# Patient Record
Sex: Male | Born: 1960 | Race: White | Hispanic: No | Smoking: Never smoker
Health system: Southern US, Community
[De-identification: ages and names within clinical notes are randomized; demographics above are authoritative.]

## PROBLEM LIST (undated history)

## (undated) DIAGNOSIS — R51 Headache: Secondary | ICD-10-CM

## (undated) DIAGNOSIS — M2550 Pain in unspecified joint: Secondary | ICD-10-CM

## (undated) DIAGNOSIS — H33313 Horseshoe tear of retina without detachment, bilateral: Secondary | ICD-10-CM

## (undated) DIAGNOSIS — H269 Unspecified cataract: Secondary | ICD-10-CM

## (undated) DIAGNOSIS — M1990 Unspecified osteoarthritis, unspecified site: Secondary | ICD-10-CM

## (undated) DIAGNOSIS — S83106A Unspecified dislocation of unspecified knee, initial encounter: Secondary | ICD-10-CM

## (undated) DIAGNOSIS — E785 Hyperlipidemia, unspecified: Secondary | ICD-10-CM

## (undated) DIAGNOSIS — R569 Unspecified convulsions: Secondary | ICD-10-CM

## (undated) DIAGNOSIS — I10 Essential (primary) hypertension: Secondary | ICD-10-CM

## (undated) DIAGNOSIS — H5440 Blindness, one eye, unspecified eye: Secondary | ICD-10-CM

## (undated) HISTORY — PX: POLYPECTOMY: SHX149

## (undated) HISTORY — PX: WISDOM TOOTH EXTRACTION: SHX21

## (undated) HISTORY — PX: OTHER SURGICAL HISTORY: SHX169

## (undated) HISTORY — PX: EYE SURGERY: SHX253

## (undated) HISTORY — PX: COLONOSCOPY: SHX174

---
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
## 1999-01-26 ENCOUNTER — Emergency Department (HOSPITAL_COMMUNITY): Admission: EM | Admit: 1999-01-26 | Discharge: 1999-01-26 | Payer: Self-pay | Admitting: Emergency Medicine

## 2001-08-10 ENCOUNTER — Emergency Department (HOSPITAL_COMMUNITY): Admission: AC | Admit: 2001-08-10 | Discharge: 2001-08-10 | Payer: Self-pay

## 2001-08-10 ENCOUNTER — Encounter: Payer: Self-pay | Admitting: Emergency Medicine

## 2001-08-16 ENCOUNTER — Emergency Department (HOSPITAL_COMMUNITY): Admission: EM | Admit: 2001-08-16 | Discharge: 2001-08-16 | Payer: Self-pay | Admitting: Emergency Medicine

## 2002-07-21 ENCOUNTER — Inpatient Hospital Stay (HOSPITAL_COMMUNITY): Admission: AC | Admit: 2002-07-21 | Discharge: 2002-07-23 | Payer: Self-pay

## 2005-01-03 ENCOUNTER — Ambulatory Visit (HOSPITAL_COMMUNITY): Admission: RE | Admit: 2005-01-03 | Discharge: 2005-01-03 | Payer: Self-pay | Admitting: Internal Medicine

## 2007-02-10 ENCOUNTER — Emergency Department (HOSPITAL_COMMUNITY): Admission: EM | Admit: 2007-02-10 | Discharge: 2007-02-10 | Payer: Self-pay | Admitting: Emergency Medicine

## 2007-02-14 ENCOUNTER — Ambulatory Visit (HOSPITAL_COMMUNITY): Admission: RE | Admit: 2007-02-14 | Discharge: 2007-02-14 | Payer: Self-pay | Admitting: Emergency Medicine

## 2007-02-15 ENCOUNTER — Emergency Department (HOSPITAL_COMMUNITY): Admission: EM | Admit: 2007-02-15 | Discharge: 2007-02-15 | Payer: Self-pay | Admitting: Emergency Medicine
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
## 2011-01-17 LAB — I-STAT 8, (EC8 V) (CONVERTED LAB)
Acid-base deficit: 1
BUN: 21
Bicarbonate: 24.1 — ABNORMAL HIGH
Chloride: 108
Glucose, Bld: 97
HCT: 45
Hemoglobin: 15.3
Operator id: 285491
Potassium: 4.3
Sodium: 140
TCO2: 25
pCO2, Ven: 41.9 — ABNORMAL LOW
pH, Ven: 7.369 — ABNORMAL HIGH

## 2011-01-17 LAB — POCT I-STAT CREATININE
Creatinine, Ser: 1.2
Operator id: 285491

## 2012-02-12 ENCOUNTER — Other Ambulatory Visit: Payer: Self-pay | Admitting: Orthopedic Surgery

## 2012-02-12 MED ORDER — BUPIVACAINE LIPOSOME 1.3 % IJ SUSP: 20.0000 mL | Freq: Once | INTRAMUSCULAR | Status: DC

## 2012-02-12 MED ORDER — DEXAMETHASONE SODIUM PHOSPHATE 10 MG/ML IJ SOLN: 10.0000 mg | Freq: Once | INTRAMUSCULAR | Status: DC

## 2012-02-12 NOTE — Progress Notes (Signed)
Preoperative surgical orders have been place into the [facility] hospital system for Alexander Carney on 02/12/2012, 8:29 AM  by Patrica Duel for surgery on 03/22/12.  Preop Total Hip orders including Experel Injecion, IV Tylenol, and IV Decadron as long as there are no contraindications to the above medications. [name], PA-C

## 2012-03-12 ENCOUNTER — Encounter (HOSPITAL_COMMUNITY): Payer: Self-pay

## 2012-03-15 ENCOUNTER — Encounter (HOSPITAL_COMMUNITY)
Admission: RE | Admit: 2012-03-15 | Discharge: 2012-03-15 | Disposition: A | Discharge status: Home / Self Care | Payer: 59 | Source: Ambulatory Visit | Attending: Orthopedic Surgery | Admitting: Orthopedic Surgery

## 2012-03-15 ENCOUNTER — Encounter (HOSPITAL_COMMUNITY): Payer: Self-pay

## 2012-03-15 HISTORY — DX: Hyperlipidemia, unspecified: E78.5

## 2012-03-15 HISTORY — DX: Unspecified osteoarthritis, unspecified site: M19.90

## 2012-03-15 HISTORY — DX: Pain in unspecified joint: M25.50

## 2012-03-15 HISTORY — DX: Unspecified dislocation of unspecified knee, initial encounter: S83.106A

## 2012-03-15 HISTORY — DX: Unspecified cataract: H26.9

## 2012-03-15 HISTORY — DX: Unspecified convulsions: R56.9

## 2012-03-15 HISTORY — DX: Headache: R51

## 2012-03-15 LAB — CBC
MCH: 31.7 pg (ref 26.0–34.0)
Platelets: 201 10*3/uL (ref 150–400)
RBC: 4.92 MIL/uL (ref 4.22–5.81)

## 2012-03-15 LAB — URINALYSIS, ROUTINE W REFLEX MICROSCOPIC
Leukocytes, UA: NEGATIVE
Protein, ur: 30 mg/dL — AB
Urobilinogen, UA: 1 mg/dL (ref 0.0–1.0)

## 2012-03-15 LAB — SURGICAL PCR SCREEN: MRSA, PCR: NEGATIVE

## 2012-03-15 LAB — COMPREHENSIVE METABOLIC PANEL
ALT: 25 U/L (ref 0–53)
AST: 19 U/L (ref 0–37)
CO2: 26 mEq/L (ref 19–32)
Calcium: 8.9 mg/dL (ref 8.4–10.5)
GFR calc non Af Amer: >90 mL/min (ref >90)
Potassium: 4 mEq/L (ref 3.5–5.1)
Sodium: 140 mEq/L (ref 135–145)
Total Protein: 6.7 g/dL (ref 6.0–8.3)

## 2012-03-15 LAB — TYPE AND SCREEN
ABO/RH(D): B POS
Antibody Screen: NEGATIVE

## 2012-03-15 LAB — APTT: aPTT: 29 seconds (ref 24–37)

## 2012-03-15 MED ORDER — CHLORHEXIDINE GLUCONATE 4 % EX LIQD: 60.0000 mL | Freq: Once | CUTANEOUS | Status: DC

## 2012-03-15 NOTE — Progress Notes (Signed)
Pt doesn't have a cardiologist  Denies ever having a stress test/echo/heart cath  Dr.Alva is Medical MD   Denies ekg or cxr within the past year

## 2012-03-15 NOTE — Progress Notes (Signed)
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

## 2012-03-15 NOTE — Pre-Procedure Instructions (Signed)
20 Stevin C Ziemba  03/15/2012   Your procedure is scheduled on:  03-22-2012  Report to [facility] at 0830 AM.  Call this number if you have problems the morning of surgery: [phone]   Remember:   Do not eat food:After Midnight.  May have clear liquids:until Midnight .  Clear liquids include soda, tea, black coffee, apple or grape juice, broth.  Take these medicines the morning of surgery with A SIP OF WATER: carbamazepine   Do not wear jewelry, make-up or nail polish.  Do not wear lotions, powders, or perfumes. You may wear deodorant.  Do not shave 48 hours prior to surgery. Men may shave face and neck.  Do not bring valuables to the hospital.  Contacts, dentures or bridgework may not be worn into surgery.  Leave suitcase in the car. After surgery it may be brought to your room.  For patients admitted to the hospital, checkout time is 11:00 AM the day of discharge.   Patients discharged the day of surgery will not be allowed to drive home.  Name and phone number of your driver: Michelle Schirmer  [phone]  Special Instructions: Incentive Spirometry - Practice and bring it with you on the day of surgery. Shower using CHG 2 nights before surgery and the night before surgery.  If you shower the day of surgery use CHG.  Use special wash - you have one bottle of CHG for all showers.  You should use approximately 1/3 of the bottle for each shower.   Please read over the following fact sheets that you were given: Pain Booklet, Coughing and Deep Breathing, Blood Transfusion Information, Lab Information, Total Joint Packet, MRSA Information and Surgical Site Infection Prevention   

## 2012-03-15 NOTE — Pre-Procedure Instructions (Signed)
20 HILLEL CARD  03/15/2012   Your procedure is scheduled on:  03-22-2012  Report to [facility] at 0830 AM.  Call this number if you have problems the morning of surgery: [phone]   Remember:   Do not eat food:After Midnight.  May have clear liquids:until Midnight .  Clear liquids include soda, tea, black coffee, apple or grape juice, broth.  Take these medicines the morning of surgery with A SIP OF WATER: carbamazepine   Do not wear jewelry, make-up or nail polish.  Do not wear lotions, powders, or perfumes. You may wear deodorant.  Do not shave 48 hours prior to surgery. Men may shave face and neck.  Do not bring valuables to the hospital.  Contacts, dentures or bridgework may not be worn into surgery.  Leave suitcase in the car. After surgery it may be brought to your room.  For patients admitted to the hospital, checkout time is 11:00 AM the day of discharge.   Patients discharged the day of surgery will not be allowed to drive home.  Name and phone number of your driver: darick fetters  [phone]  Special Instructions: Incentive Spirometry - Practice and bring it with you on the day of surgery. Shower using CHG 2 nights before surgery and the night before surgery.  If you shower the day of surgery use CHG.  Use special wash - you have one bottle of CHG for all showers.  You should use approximately 1/3 of the bottle for each shower.   Please read over the following fact sheets that you were given: Pain Booklet, Coughing and Deep Breathing, Blood Transfusion Information, Lab Information, Total Joint Packet, MRSA Information and Surgical Site Infection Prevention

## 2012-03-15 NOTE — Pre-Procedure Instructions (Signed)
20 Alexander Carney  03/15/2012   Your procedure is scheduled on:  Fri, Dec 13 @ 10:30 AM  Report to [facility] at 8:30 AM.  Call this number if you have problems the morning of surgery: [phone]   Remember:   Do not eat food:After Midnight.  Take these medicines the morning of surgery with A SIP OF WATER: Carbamazepine(Tegretol)   Do not wear jewelry  Do not wear lotions, powders, or colognes. You may wear deodorant.   Men may shave face and neck.  Do not bring valuables to the hospital.  Contacts, dentures or bridgework may not be worn into surgery.  Leave suitcase in the car. After surgery it may be brought to your room.  For patients admitted to the hospital, checkout time is 11:00 AM the day of discharge.   Patients discharged the day of surgery will not be allowed to drive home.  Special Instructions: Shower using CHG 2 nights before surgery and the night before surgery.  If you shower the day of surgery use CHG.  Use special wash - you have one bottle of CHG for all showers.  You should use approximately 1/3 of the bottle for each shower.   Please read over the following fact sheets that you were given: Pain Booklet, Coughing and Deep Breathing, Blood Transfusion Information, Total Joint Packet, MRSA Information and Surgical Site Infection Prevention

## 2012-03-21 ENCOUNTER — Other Ambulatory Visit: Payer: Self-pay | Admitting: Orthopedic Surgery

## 2012-03-21 MED ORDER — DEXTROSE 5 % IV SOLN
3.0000 g | INTRAVENOUS | Status: AC
  Administered 2012-03-22: 3 g via INTRAVENOUS
  Filled 2012-03-21: qty 3000

## 2012-03-21 NOTE — H&P (Signed)
Alexander Carney  DOB: 11/09/1960 Married / Language: English / Race: White Male  Date of Admission:  03/22/2012  Chief Complaint:  Left Hip Pain  History of Present Illness The patient is a 51 year old male who comes in for a preoperative History and Physical. The patient is scheduled for a left total hip arthroplasty - anterior approach to be performed by Dr. Gus Rankin. [name], MD at [facility] on 03/22/2012. The patient is a 51 year old male who presents with a hip problem. The patient reports left hip problems including pain symptoms that have been present for 2 year(s). The symptoms began without any known injury. Symptoms reported include hip pain, pain with weightbearing and difficulty ambulating The patient reports symptoms radiating to the: left groin and left thigh. Onset of symptoms was with symptoms now occuring intermittently.The patient feels as if their symptoms are does feel they are worsening. Prior to being seen today the patient was previously evaluated by a colleague (Dr. [name]) 2 month(s) ago. Previous workup for this problem has included hip x-rays. Previous treatment for this problem has included none (he only had one visit with him. He was given some Celebrex samples, but he never took them). The patient's left hip is now bothering him at all times. It is getting progressively worse over the past few years. He works in Scient[employer], [employer] and is having a hard time doing a lot of his occupational activities. He is also having a very difficult time doing recreational activities. This is all getting worse. He can no longer tie his shoes. He has lost all of the movement in that left hip. He recently saw Dr. [name] and was told that he had advanced arthritis. His pain has been progressive in nature and it is now felt that he would benefit from undergoing a left total hip replacement. They have been treated conservatively in the past for the above stated problem  and despite conservative measures, they continue to have progressive pain and severe functional limitations and dysfunction. They have failed non-operative management including home exercise, medications. It is felt that they would benefit from undergoing total joint replacement. Risks and benefits of the procedure have been discussed with the patient and they elect to proceed with surgery. There are no active contraindications to surgery such as ongoing infection or rapidly progressive neurological disease.   Problem List Osteoarthritis, Hip (715.35)   Allergies No Known Drug Allergies   Family History Heart Disease. father, grandmother mothers side and grandfather mothers side Cerebrovascular Accident. 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 greater than 5 Marital status. married Illicit drug use. no Living situation. live with spouse Tobacco use. never smoker Tobacco / smoke exposure. yes outdoors only Pain Contract. no Previously in rehab. no Exercise. Exercises monthly; does other Current work status. working full time Drug/Alcohol Rehab (Currently). no Alcohol use. current drinker; drinks beer; only occasionally per week Children. 2   Past Surgical History Cataract Surgery. Date: 2009. left Repair Left Eye Retina. Date: 02/2010. Left Eye Corneal Transplant Laser Surgery Left Eye. Date: 11/2010.  Medical History Hypercholesterolemia Seizure Disorder   Review of Systems General:Not Present- Chills, Fever, Night Sweats, Appetite Loss, Fatigue, Feeling sick, Weight Gain and Weight Loss. Skin:Not Present- Itching, Rash, Skin Color Changes, Ulcer, Psoriasis and Change in Hair or Nails. HEENT:Present- Visual Loss. Not Present- Sensitivity to light, Nose Bleed, Decreased Hearing  and Ringing in the  Ears. Neck:Not Present- Swollen Glands and Neck Mass. Respiratory:Not Present- Shortness of breath, Snoring, Chronic Cough and Bloody sputum. Cardiovascular:Not Present- Shortness of Breath, Chest Pain, Swelling of Extremities, Leg Cramps and Palpitations. Gastrointestinal:Not Present- Bloody Stool, Heartburn, Abdominal Pain, Vomiting, Nausea and Incontinence of Stool. Male Genitourinary:Not Present- Blood in Urine, Frequency, Incontinence and Nocturia. Musculoskeletal:Present- Joint Stiffness. Not Present- Muscle Weakness, Muscle Pain, Joint Swelling, Joint Pain and Back Pain. Neurological:Not Present- Tingling, Numbness, Burning, Tremor, Headaches and Dizziness. Psychiatric:Not Present- Anxiety, Depression and Memory Loss. Endocrine:Not Present- Cold Intolerance, Heat Intolerance, Excessive hunger and Excessive Thirst. Hematology:Not Present- Abnormal Bleeding, Abnormal bruising, Anemia and Blood Clots.   Vitals Weight: 220 lb Height: 70 in Weight was reported by patient. Height was reported by patient. Body Surface Area: 2.22 m Body Mass Index: 31.57 kg/m Pulse: 76 (Regular) Resp.: 16 (Unlabored) BP: 152/88 (Sitting, Left Arm, Standard)    Physical Exam The physical exam findings are as follows:   General Mental Status - Alert, cooperative and good historian. General Appearance- pleasant. Not in acute distress. Orientation- Oriented X3. Build & Nutrition- Well nourished and Well developed.   Head and Neck Head- normocephalic, atraumatic . Neck Global Assessment- supple. no bruit auscultated on the right and no bruit auscultated on the left.   Eye Pupil- Left- Note: prosthetic eye Right- Regular and Round. Motion- Bilateral- EOMI.   Chest and Lung Exam Auscultation: Breath sounds:- clear at anterior chest wall and - clear at posterior chest wall. Adventitious sounds:- No Adventitious  sounds.   Cardiovascular Auscultation:Rhythm- Regular rate and rhythm. Heart Sounds- S1 WNL and S2 WNL. Murmurs & Other Heart Sounds:Auscultation of the heart reveals - No Murmurs.   Abdomen Palpation/Percussion:Tenderness- Abdomen is non-tender to palpation. Rigidity (guarding)- Abdomen is soft. Auscultation:Auscultation of the abdomen reveals - Bowel sounds normal.   Male Genitourinary  Not done, not pertinent to present illness  Musculoskeletal On exam a well developed male, alert and oriented in no apparent distress. His right hip can be flexed to 110, rotated in 30, out 40, abducted 40 without discomfort. The left hip flexes to 95, no internal or external rotation, only about 10 degrees of abduction. The knee exams are normal bilaterally. Pulses sensation motor intact. Gait pattern is antalgic on the left.  RADIOGRAPHS: AP pelvis and lateral of the left hip show advanced endstage arthritis of the left hip, bone on bone near ankylosis and large osteophytes. The right hip is degenerative also, but much lesser degree.  Assessment & Plan Osteoarthritis, Hip (715.35) Impression: Left Hip  Note: Plan is for a Left Total Hip Repalcement - Anterior Approach by Dr. Lequita Halt.  Plan is to go home.  PCP - Dr. Felipa Eth  Signed electronically by Roberts Gaudy, PA-C

## 2012-03-22 ENCOUNTER — Inpatient Hospital Stay (HOSPITAL_COMMUNITY)
Admission: RE | Admit: 2012-03-22 | Discharge: 2012-03-25 | DRG: 470 | Disposition: A | Discharge status: Home / Self Care | Payer: 59 | Source: Ambulatory Visit | Attending: Orthopedic Surgery | Admitting: Orthopedic Surgery

## 2012-03-22 ENCOUNTER — Ambulatory Visit (HOSPITAL_COMMUNITY): Payer: 59 | Admitting: Anesthesiology

## 2012-03-22 ENCOUNTER — Encounter (HOSPITAL_COMMUNITY): Payer: Self-pay | Admitting: Anesthesiology

## 2012-03-22 ENCOUNTER — Ambulatory Visit (HOSPITAL_COMMUNITY): Payer: 59

## 2012-03-22 ENCOUNTER — Encounter (HOSPITAL_COMMUNITY)
Admission: RE | Disposition: A | Discharge status: Home / Self Care | Payer: Self-pay | Source: Ambulatory Visit | Attending: Orthopedic Surgery

## 2012-03-22 ENCOUNTER — Inpatient Hospital Stay (HOSPITAL_COMMUNITY): Payer: 59

## 2012-03-22 DIAGNOSIS — M1610 Unilateral primary osteoarthritis, unspecified hip: Principal | ICD-10-CM | POA: Diagnosis present

## 2012-03-22 DIAGNOSIS — Z6832 Body mass index (BMI) 32.0-32.9, adult: Secondary | ICD-10-CM

## 2012-03-22 DIAGNOSIS — E669 Obesity, unspecified: Secondary | ICD-10-CM | POA: Diagnosis present

## 2012-03-22 DIAGNOSIS — E7800 Pure hypercholesterolemia, unspecified: Secondary | ICD-10-CM | POA: Diagnosis present

## 2012-03-22 DIAGNOSIS — M169 Osteoarthritis of hip, unspecified: Principal | ICD-10-CM | POA: Diagnosis present

## 2012-03-22 DIAGNOSIS — G40909 Epilepsy, unspecified, not intractable, without status epilepticus: Secondary | ICD-10-CM | POA: Diagnosis present

## 2012-03-22 DIAGNOSIS — Z01812 Encounter for preprocedural laboratory examination: Secondary | ICD-10-CM

## 2012-03-22 HISTORY — PX: TOTAL HIP ARTHROPLASTY: SHX124

## 2012-03-22 SURGERY — ARTHROPLASTY, HIP, TOTAL, ANTERIOR APPROACH
Anesthesia: General | Site: Hip | Laterality: Left | Wound class: Clean

## 2012-03-22 MED ORDER — ATORVASTATIN CALCIUM 20 MG PO TABS
20.0000 mg | ORAL_TABLET | Freq: Every day | ORAL | Status: DC
  Administered 2012-03-22 – 2012-03-24 (×3): 20 mg via ORAL
  Filled 2012-03-22 (×4): qty 1

## 2012-03-22 MED ORDER — OXYCODONE HCL 5 MG PO TABS: 5.0000 mg | ORAL_TABLET | ORAL | Status: DC | PRN

## 2012-03-22 MED ORDER — METOCLOPRAMIDE HCL 5 MG/ML IJ SOLN: 5.0000 mg | Freq: Three times a day (TID) | INTRAMUSCULAR | Status: DC | PRN

## 2012-03-22 MED ORDER — ACETAMINOPHEN 325 MG PO TABS
650.0000 mg | ORAL_TABLET | Freq: Four times a day (QID) | ORAL | Status: DC | PRN
  Administered 2012-03-23 – 2012-03-24 (×3): 650 mg via ORAL
  Filled 2012-03-22 (×3): qty 2

## 2012-03-22 MED ORDER — MIDAZOLAM HCL 2 MG/2ML IJ SOLN: 0.5000 mg | Freq: Once | INTRAMUSCULAR | Status: DC | PRN

## 2012-03-22 MED ORDER — BUPIVACAINE LIPOSOME 1.3 % IJ SUSP
20.0000 mL | Freq: Once | INTRAMUSCULAR | Status: AC
  Administered 2012-03-22: 20 mL
  Filled 2012-03-22: qty 20

## 2012-03-22 MED ORDER — FENTANYL CITRATE 0.05 MG/ML IJ SOLN
INTRAMUSCULAR | Status: DC | PRN
  Administered 2012-03-22: 50 ug via INTRAVENOUS
  Administered 2012-03-22: 250 ug via INTRAVENOUS

## 2012-03-22 MED ORDER — DOCUSATE SODIUM 100 MG PO CAPS
100.0000 mg | ORAL_CAPSULE | Freq: Two times a day (BID) | ORAL | Status: DC
  Administered 2012-03-22 – 2012-03-25 (×6): 100 mg via ORAL
  Filled 2012-03-22 (×6): qty 1

## 2012-03-22 MED ORDER — LIDOCAINE HCL (CARDIAC) 20 MG/ML IV SOLN
INTRAVENOUS | Status: DC | PRN
  Administered 2012-03-22: 30 mg via INTRAVENOUS

## 2012-03-22 MED ORDER — ACETAMINOPHEN 10 MG/ML IV SOLN
1000.0000 mg | Freq: Four times a day (QID) | INTRAVENOUS | Status: AC
  Administered 2012-03-22 – 2012-03-23 (×3): 1000 mg via INTRAVENOUS
  Filled 2012-03-22 (×4): qty 100

## 2012-03-22 MED ORDER — SODIUM CHLORIDE 0.9 % IV SOLN: INTRAVENOUS | Status: DC

## 2012-03-22 MED ORDER — ARTIFICIAL TEARS OP OINT
TOPICAL_OINTMENT | OPHTHALMIC | Status: DC | PRN
  Administered 2012-03-22: 1 via OPHTHALMIC

## 2012-03-22 MED ORDER — HYDROMORPHONE HCL PF 1 MG/ML IJ SOLN
INTRAMUSCULAR | Status: AC
  Filled 2012-03-22: qty 1

## 2012-03-22 MED ORDER — OXYCODONE HCL 5 MG/5ML PO SOLN: 5.0000 mg | Freq: Once | ORAL | Status: DC | PRN

## 2012-03-22 MED ORDER — ONDANSETRON HCL 4 MG PO TABS
4.0000 mg | ORAL_TABLET | Freq: Four times a day (QID) | ORAL | Status: DC | PRN
  Filled 2012-03-22: qty 1

## 2012-03-22 MED ORDER — PROMETHAZINE HCL 25 MG/ML IJ SOLN: 6.2500 mg | INTRAMUSCULAR | Status: DC | PRN

## 2012-03-22 MED ORDER — ACETAMINOPHEN 650 MG RE SUPP: 650.0000 mg | Freq: Four times a day (QID) | RECTAL | Status: DC | PRN

## 2012-03-22 MED ORDER — ACETAMINOPHEN 10 MG/ML IV SOLN: 1000.0000 mg | Freq: Once | INTRAVENOUS | Status: DC

## 2012-03-22 MED ORDER — LIDOCAINE HCL 4 % MT SOLN
OROMUCOSAL | Status: DC | PRN
  Administered 2012-03-22: 4 mL via TOPICAL

## 2012-03-22 MED ORDER — MORPHINE SULFATE 2 MG/ML IJ SOLN: 1.0000 mg | INTRAMUSCULAR | Status: DC | PRN

## 2012-03-22 MED ORDER — MIDAZOLAM HCL 5 MG/5ML IJ SOLN
INTRAMUSCULAR | Status: DC | PRN
  Administered 2012-03-22: 2 mg via INTRAVENOUS

## 2012-03-22 MED ORDER — PROPOFOL 10 MG/ML IV BOLUS
INTRAVENOUS | Status: DC | PRN
  Administered 2012-03-22: 200 mg via INTRAVENOUS

## 2012-03-22 MED ORDER — FLEET ENEMA 7-19 GM/118ML RE ENEM: 1.0000 | ENEMA | Freq: Once | RECTAL | Status: AC | PRN

## 2012-03-22 MED ORDER — ACETAMINOPHEN 10 MG/ML IV SOLN
INTRAVENOUS | Status: AC
  Filled 2012-03-22: qty 100

## 2012-03-22 MED ORDER — LACTATED RINGERS IV SOLN
INTRAVENOUS | Status: DC | PRN
  Administered 2012-03-22 (×3): via INTRAVENOUS

## 2012-03-22 MED ORDER — DEXAMETHASONE SODIUM PHOSPHATE 10 MG/ML IJ SOLN
10.0000 mg | Freq: Once | INTRAMUSCULAR | Status: DC
  Filled 2012-03-22: qty 1

## 2012-03-22 MED ORDER — DEXAMETHASONE 6 MG PO TABS
10.0000 mg | ORAL_TABLET | Freq: Once | ORAL | Status: DC
  Filled 2012-03-22: qty 1

## 2012-03-22 MED ORDER — CEFAZOLIN SODIUM-DEXTROSE 2-3 GM-% IV SOLR
2.0000 g | Freq: Four times a day (QID) | INTRAVENOUS | Status: AC
  Administered 2012-03-22 (×2): 2 g via INTRAVENOUS
  Filled 2012-03-22 (×2): qty 50

## 2012-03-22 MED ORDER — ROCURONIUM BROMIDE 100 MG/10ML IV SOLN
INTRAVENOUS | Status: DC | PRN
  Administered 2012-03-22: 50 mg via INTRAVENOUS

## 2012-03-22 MED ORDER — ENOXAPARIN SODIUM 40 MG/0.4ML ~~LOC~~ SOLN
40.0000 mg | SUBCUTANEOUS | Status: DC
  Administered 2012-03-23 – 2012-03-25 (×3): 40 mg via SUBCUTANEOUS
  Filled 2012-03-22 (×4): qty 0.4

## 2012-03-22 MED ORDER — TRAMADOL HCL 50 MG PO TABS
50.0000 mg | ORAL_TABLET | Freq: Four times a day (QID) | ORAL | Status: DC | PRN
  Administered 2012-03-22: 50 mg via ORAL
  Administered 2012-03-23: 100 mg via ORAL
  Administered 2012-03-23: 50 mg via ORAL
  Administered 2012-03-25: 100 mg via ORAL
  Filled 2012-03-22: qty 2
  Filled 2012-03-22: qty 1
  Filled 2012-03-22: qty 2
  Filled 2012-03-22: qty 1
  Filled 2012-03-22: qty 2

## 2012-03-22 MED ORDER — OXYCODONE HCL 5 MG PO TABS: 5.0000 mg | ORAL_TABLET | Freq: Once | ORAL | Status: DC | PRN

## 2012-03-22 MED ORDER — 0.9 % SODIUM CHLORIDE (POUR BTL) OPTIME
TOPICAL | Status: DC | PRN
  Administered 2012-03-22: 1000 mL

## 2012-03-22 MED ORDER — METOCLOPRAMIDE HCL 5 MG PO TABS
5.0000 mg | ORAL_TABLET | Freq: Three times a day (TID) | ORAL | Status: DC | PRN
  Filled 2012-03-22: qty 2

## 2012-03-22 MED ORDER — ONDANSETRON HCL 4 MG/2ML IJ SOLN: 4.0000 mg | Freq: Four times a day (QID) | INTRAMUSCULAR | Status: DC | PRN

## 2012-03-22 MED ORDER — MENTHOL 3 MG MT LOZG: 1.0000 | LOZENGE | OROMUCOSAL | Status: DC | PRN

## 2012-03-22 MED ORDER — METHOCARBAMOL 100 MG/ML IJ SOLN
500.0000 mg | Freq: Four times a day (QID) | INTRAVENOUS | Status: DC | PRN
  Administered 2012-03-22: 500 mg via INTRAVENOUS
  Filled 2012-03-22: qty 5

## 2012-03-22 MED ORDER — BISACODYL 10 MG RE SUPP: 10.0000 mg | Freq: Every day | RECTAL | Status: DC | PRN

## 2012-03-22 MED ORDER — ONDANSETRON HCL 4 MG/2ML IJ SOLN
INTRAMUSCULAR | Status: DC | PRN
  Administered 2012-03-22: 4 mg via INTRAVENOUS

## 2012-03-22 MED ORDER — SODIUM CHLORIDE 0.9 % IV SOLN
INTRAVENOUS | Status: DC | PRN
  Administered 2012-03-22: 50 mL via INTRAMUSCULAR

## 2012-03-22 MED ORDER — NEOSTIGMINE METHYLSULFATE 1 MG/ML IJ SOLN
INTRAMUSCULAR | Status: DC | PRN
  Administered 2012-03-22: 3 mg via INTRAVENOUS

## 2012-03-22 MED ORDER — MEPERIDINE HCL 25 MG/ML IJ SOLN: 6.2500 mg | INTRAMUSCULAR | Status: DC | PRN

## 2012-03-22 MED ORDER — METHOCARBAMOL 500 MG PO TABS
500.0000 mg | ORAL_TABLET | Freq: Four times a day (QID) | ORAL | Status: DC | PRN
  Filled 2012-03-22: qty 1

## 2012-03-22 MED ORDER — CARBAMAZEPINE ER 200 MG PO TB12
200.0000 mg | ORAL_TABLET | Freq: Two times a day (BID) | ORAL | Status: DC
  Administered 2012-03-22 – 2012-03-25 (×6): 200 mg via ORAL
  Filled 2012-03-22 (×7): qty 1

## 2012-03-22 MED ORDER — RIVAROXABAN 10 MG PO TABS: 10.0000 mg | ORAL_TABLET | Freq: Every day | ORAL | Status: DC

## 2012-03-22 MED ORDER — DIPHENHYDRAMINE HCL 12.5 MG/5ML PO ELIX: 12.5000 mg | ORAL_SOLUTION | ORAL | Status: DC | PRN

## 2012-03-22 MED ORDER — POLYETHYLENE GLYCOL 3350 17 G PO PACK: 17.0000 g | PACK | Freq: Every day | ORAL | Status: DC | PRN

## 2012-03-22 MED ORDER — PHENOL 1.4 % MT LIQD: 1.0000 | OROMUCOSAL | Status: DC | PRN

## 2012-03-22 MED ORDER — HYDROMORPHONE HCL PF 1 MG/ML IJ SOLN
0.2500 mg | INTRAMUSCULAR | Status: DC | PRN
Start: 2012-03-22 — End: 2012-03-22
  Administered 2012-03-22 (×2): 0.25 mg via INTRAVENOUS
  Administered 2012-03-22: 0.5 mg via INTRAVENOUS

## 2012-03-22 MED ORDER — GLYCOPYRROLATE 0.2 MG/ML IJ SOLN
INTRAMUSCULAR | Status: DC | PRN
  Administered 2012-03-22: 0.4 mg via INTRAVENOUS

## 2012-03-22 MED ORDER — ACETAMINOPHEN 10 MG/ML IV SOLN
1000.0000 mg | Freq: Once | INTRAVENOUS | Status: AC
  Administered 2012-03-22: 1000 mg via INTRAVENOUS

## 2012-03-22 SURGICAL SUPPLY — 39 items
BLADE SAW SGTL 18X1.27X75 (BLADE) ×2 IMPLANT
CLOTH BEACON ORANGE TIMEOUT ST (SAFETY) ×2 IMPLANT
CLSR STERI-STRIP ANTIMIC 1/2X4 (GAUZE/BANDAGES/DRESSINGS) ×4 IMPLANT
DECANTER SPIKE VIAL GLASS SM (MISCELLANEOUS) ×2 IMPLANT
DRAPE C-ARM 42X72 X-RAY (DRAPES) ×2 IMPLANT
DRAPE STERI IOBAN 125X83 (DRAPES) ×2 IMPLANT
DRAPE U-SHAPE 47X51 STRL (DRAPES) ×6 IMPLANT
DRSG ADAPTIC 3X8 NADH LF (GAUZE/BANDAGES/DRESSINGS) ×2 IMPLANT
DRSG MEPILEX BORDER 4X4 (GAUZE/BANDAGES/DRESSINGS) IMPLANT
DRSG MEPILEX BORDER 4X8 (GAUZE/BANDAGES/DRESSINGS) ×2 IMPLANT
DURAPREP 26ML APPLICATOR (WOUND CARE) ×2 IMPLANT
ELECT BLADE 6.5 EXT (BLADE) ×2 IMPLANT
ELECT REM PT RETURN 9FT ADLT (ELECTROSURGICAL) ×2
ELECTRODE REM PT RTRN 9FT ADLT (ELECTROSURGICAL) ×1 IMPLANT
EVACUATOR 1/8 PVC DRAIN (DRAIN) IMPLANT
FACESHIELD LNG OPTICON STERILE (SAFETY) ×8 IMPLANT
GLOVE BIO SURGEON STRL SZ8 (GLOVE) ×4 IMPLANT
GLOVE BIOGEL PI IND STRL 8 (GLOVE) ×1 IMPLANT
GLOVE BIOGEL PI INDICATOR 8 (GLOVE) ×1
GLOVE ECLIPSE 8.0 STRL XLNG CF (GLOVE) ×2 IMPLANT
GOWN BRE IMP PREV XXLGXLNG (GOWN DISPOSABLE) ×4 IMPLANT
GOWN STRL NON-REIN LRG LVL3 (GOWN DISPOSABLE) ×2 IMPLANT
KIT BASIN OR (CUSTOM PROCEDURE TRAY) ×2 IMPLANT
NDL SAFETY ECLIPSE 18X1.5 (NEEDLE) ×1 IMPLANT
NEEDLE HYPO 18GX1.5 SHARP (NEEDLE) ×1
PACK TOTAL JOINT (CUSTOM PROCEDURE TRAY) ×2 IMPLANT
PADDING CAST COTTON 6X4 STRL (CAST SUPPLIES) ×2 IMPLANT
SPONGE GAUZE 4X4 12PLY (GAUZE/BANDAGES/DRESSINGS) ×2 IMPLANT
SUCTION FRAZIER TIP 10 FR DISP (SUCTIONS) ×2 IMPLANT
SUT ETHIBOND NAB CT1 #1 30IN (SUTURE) ×6 IMPLANT
SUT MNCRL AB 4-0 PS2 18 (SUTURE) ×2 IMPLANT
SUT VIC AB 1 CT1 27 (SUTURE) ×1
SUT VIC AB 1 CT1 27XBRD ANTBC (SUTURE) ×1 IMPLANT
SUT VIC AB 2-0 CT1 27 (SUTURE) ×2
SUT VIC AB 2-0 CT1 TAPERPNT 27 (SUTURE) ×2 IMPLANT
SUT VLOC 180 0 24IN GS25 (SUTURE) ×2 IMPLANT
SYR 50ML LL SCALE MARK (SYRINGE) ×2 IMPLANT
TOWEL OR 17X26 10 PK STRL BLUE (TOWEL DISPOSABLE) ×4 IMPLANT
TRAY FOLEY CATH 14FRSI W/METER (CATHETERS) ×2 IMPLANT

## 2012-03-22 NOTE — Interval H&P Note (Signed)
History and Physical Interval Note:  03/22/2012 9:57 AM  Alexander Carney  has presented today for surgery, with the diagnosis of oa left hip   The various methods of treatment have been discussed with the patient and family. After consideration of risks, benefits and other options for treatment, the patient has consented to  Procedure(s) (LRB) with comments: TOTAL HIP ARTHROPLASTY ANTERIOR APPROACH (Left) as a surgical intervention .  The patient's history has been reviewed, patient examined, no change in status, stable for surgery.  I have reviewed the patient's chart and labs.  Questions were answered to the patient's satisfaction.     Loanne Drilling

## 2012-03-22 NOTE — Transfer of Care (Signed)
Immediate Anesthesia Transfer of Care Note  Patient: Alexander Carney  Procedure(s) Performed: Procedure(s) (LRB) with comments: TOTAL HIP ARTHROPLASTY ANTERIOR APPROACH (Left)  Patient Location: PACU  Anesthesia Type:General  Level of Consciousness: awake, alert  and oriented  Airway & Oxygen Therapy: Patient Spontanous Breathing and Patient connected to nasal cannula oxygen  Post-op Assessment: Report given to PACU RN  Post vital signs: Reviewed  Complications: No apparent anesthesia complications

## 2012-03-22 NOTE — Anesthesia Postprocedure Evaluation (Signed)
  Anesthesia Post-op Note  Patient: Alexander Carney  Procedure(s) Performed: Procedure(s) (LRB) with comments: TOTAL HIP ARTHROPLASTY ANTERIOR APPROACH (Left)  Patient Location: PACU  Anesthesia Type:General  Level of Consciousness: awake, alert , oriented and patient cooperative  Airway and Oxygen Therapy: Patient Spontanous Breathing  Post-op Pain: mild  Post-op Assessment: Post-op Vital signs reviewed, Patient's Cardiovascular Status Stable, Respiratory Function Stable, Patent Airway, No signs of Nausea or vomiting and Pain level controlled  Post-op Vital Signs: Reviewed and stable  Complications: No apparent anesthesia complications

## 2012-03-22 NOTE — Brief Op Note (Signed)
03/22/2012  12:22 PM  PATIENT:  Alexander Carney  51 y.o. male  PRE-OPERATIVE DIAGNOSIS:  oa left hip   POST-OPERATIVE DIAGNOSIS:  oa left hip   PROCEDURE:  Procedure(s) (LRB) with comments: TOTAL HIP ARTHROPLASTY ANTERIOR APPROACH (Left)  SURGEON:  Surgeon(s) and Role:    * Loanne Drilling, MD - Primary  PHYSICIAN ASSISTANT:   ASSISTANTS: Dimitri Ped, PA-C    ANESTHESIA:   general  EBL:  Total I/O In: 2000 [I.V.:2000] Out: 400 [Urine:150; Blood:250]  BLOOD ADMINISTERED:none  DRAINS: (Medium) Hemovact drain(s) in the left hip with  Suction Open   LOCAL MEDICATIONS USED:  OTHER Exparel 20 ml  COUNTS:  YES  TOURNIQUET:  * No tourniquets in log *  DICTATION: .Other Dictation: Dictation Number [id number]  PLAN OF CARE: Admit to inpatient   PATIENT DISPOSITION:  PACU - hemodynamically stable.

## 2012-03-22 NOTE — Anesthesia Preprocedure Evaluation (Addendum)
Anesthesia Evaluation  Patient identified by MRN, date of birth, ID band Patient awake    Reviewed: Allergy & Precautions, H&P , NPO status , Patient's Chart, lab work & pertinent test results  History of Anesthesia Complications Negative for: history of anesthetic complications  Airway Mallampati: II TM Distance: >3 FB Neck ROM: Full    Dental No notable dental hx. (+) Teeth Intact and Dental Advisory Given   Pulmonary neg pulmonary ROS,  breath sounds clear to auscultation  Pulmonary exam normal       Cardiovascular negative cardio ROS  Rhythm:Regular Rate:Normal     Neuro/Psych  Headaches, Seizures - (last seizure 2008), Well Controlled,  negative psych ROS   GI/Hepatic negative GI ROS, Neg liver ROS,   Endo/Other  negative endocrine ROS  Renal/GU negative Renal ROS     Musculoskeletal   Abdominal Normal abdominal exam  (+) + obese,   Peds  Hematology negative hematology ROS (+)   Anesthesia Other Findings   Reproductive/Obstetrics                          Anesthesia Physical Anesthesia Plan  ASA: II  Anesthesia Plan: General   Post-op Pain Management:    Induction: Intravenous  Airway Management Planned: Oral ETT  Additional Equipment:   Intra-op Plan:   Post-operative Plan: Extubation in OR  Informed Consent: I have reviewed the patients History and Physical, chart, labs and discussed the procedure including the risks, benefits and alternatives for the proposed anesthesia with the patient or authorized representative who has indicated his/her understanding and acceptance.   Dental advisory given  Plan Discussed with: CRNA, Anesthesiologist and Surgeon  Anesthesia Plan Comments: (Plan routine monitors, GETA)       Anesthesia Quick Evaluation

## 2012-03-22 NOTE — Plan of Care (Signed)
Problem: Consults Goal: Diagnosis- Total Joint Replacement Primary Total Hip-left        

## 2012-03-22 NOTE — H&P (View-Only) (Signed)
Alexander Carney  DOB: 11/15/1960 Married / Language: English / Race: White Male  Date of Admission:  03/22/2012  Chief Complaint:  Left Hip Pain  History of Present Illness The patient is a 51 year old male who comes in for a preoperative History and Physical. The patient is scheduled for a left total hip arthroplasty - anterior approach to be performed by Dr. [name], MD at [facility] on 03/22/2012. The patient is a 50 year old male who presents with a hip problem. The patient reports left hip problems including pain symptoms that have been present for 2 year(s). The symptoms began without any known injury. Symptoms reported include hip pain, pain with weightbearing and difficulty ambulating The patient reports symptoms radiating to the: left groin and left thigh. Onset of symptoms was with symptoms now occuring intermittently.The patient feels as if their symptoms are does feel they are worsening. Prior to being seen today the patient was previously evaluated by a colleague (Dr. [name]) 2 month(s) ago. Previous workup for this problem has included hip x-rays. Previous treatment for this problem has included none (he only had one visit with him. He was given some Celebrex samples, but he never took them). The patient's left hip is now bothering him at all times. It is getting progressively worse over the past few years. 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 He is also having a very difficult time doing recreational activities. This is all getting worse. He can no longer tie his shoes. He has lost all of the movement in that left hip. He recently saw Dr. [name] and was told that he had advanced arthritis. His pain has been progressive in nature and it is now felt that he would benefit from undergoing a left total hip replacement. They have been treated conservatively in the past for the above stated problem  and despite conservative measures, they continue to have progressive pain and severe functional limitations and dysfunction. They have failed non-operative management including home exercise, medications. It is felt that they would benefit from undergoing total joint replacement. Risks and benefits of the procedure have been discussed with the patient and they elect to proceed with surgery. There are no active contraindications to surgery such as ongoing infection or rapidly progressive neurological disease.   Problem List Osteoarthritis, Hip (715.35)   Allergies No Known Drug Allergies   Family History Heart Disease. father, grandmother mothers side and grandfather mothers side Cerebrovascular Accident. 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 greater than 5 Marital status. married Illicit drug use. no Living situation. live with spouse Tobacco use. never smoker Tobacco / smoke exposure. yes outdoors only Pain Contract. no Previously in rehab. no Exercise. Exercises monthly; does other Current work status. working full time Drug/Alcohol Rehab (Currently). no Alcohol use. current drinker; drinks beer; only occasionally per week Children. 2   Past Surgical History Cataract Surgery. Date: 2009. left Repair Left Eye Retina. Date: 02/2010. Left Eye Corneal Transplant Laser Surgery Left Eye. Date: 11/2010.  Medical History Hypercholesterolemia Seizure Disorder   Review of Systems General:Not Present- Chills, Fever, Night Sweats, Appetite Loss, Fatigue, Feeling sick, Weight Gain and Weight Loss. Skin:Not Present- Itching, Rash, Skin Color Changes, Ulcer, Psoriasis and Change in Hair or Nails. HEENT:Present- Visual Loss. Not Present- Sensitivity to light, Nose Bleed, Decreased Hearing   and Ringing in the  Ears. Neck:Not Present- Swollen Glands and Neck Mass. Respiratory:Not Present- Shortness of breath, Snoring, Chronic Cough and Bloody sputum. Cardiovascular:Not Present- Shortness of Breath, Chest Pain, Swelling of Extremities, Leg Cramps and Palpitations. Gastrointestinal:Not Present- Bloody Stool, Heartburn, Abdominal Pain, Vomiting, Nausea and Incontinence of Stool. Male Genitourinary:Not Present- Blood in Urine, Frequency, Incontinence and Nocturia. Musculoskeletal:Present- Joint Stiffness. Not Present- Muscle Weakness, Muscle Pain, Joint Swelling, Joint Pain and Back Pain. Neurological:Not Present- Tingling, Numbness, Burning, Tremor, Headaches and Dizziness. Psychiatric:Not Present- Anxiety, Depression and Memory Loss. Endocrine:Not Present- Cold Intolerance, Heat Intolerance, Excessive hunger and Excessive Thirst. Hematology:Not Present- Abnormal Bleeding, Abnormal bruising, Anemia and Blood Clots.   Vitals Weight: 220 lb Height: 70 in Weight was reported by patient. Height was reported by patient. Body Surface Area: 2.22 m Body Mass Index: 31.57 kg/m Pulse: 76 (Regular) Resp.: 16 (Unlabored) BP: 152/88 (Sitting, Left Arm, Standard)    Physical Exam The physical exam findings are as follows:   General Mental Status - Alert, cooperative and good historian. General Appearance- pleasant. Not in acute distress. Orientation- Oriented X3. Build & Nutrition- Well nourished and Well developed.   Head and Neck Head- normocephalic, atraumatic . Neck Global Assessment- supple. no bruit auscultated on the right and no bruit auscultated on the left.   Eye Pupil- Left- Note: prosthetic eye Right- Regular and Round. Motion- Bilateral- EOMI.   Chest and Lung Exam Auscultation: Breath sounds:- clear at anterior chest wall and - clear at posterior chest wall. Adventitious sounds:- No Adventitious  sounds.   Cardiovascular Auscultation:Rhythm- Regular rate and rhythm. Heart Sounds- S1 WNL and S2 WNL. Murmurs & Other Heart Sounds:Auscultation of the heart reveals - No Murmurs.   Abdomen Palpation/Percussion:Tenderness- Abdomen is non-tender to palpation. Rigidity (guarding)- Abdomen is soft. Auscultation:Auscultation of the abdomen reveals - Bowel sounds normal.   Male Genitourinary  Not done, not pertinent to present illness  Musculoskeletal On exam a well developed male, alert and oriented in no apparent distress. His right hip can be flexed to 110, rotated in 30, out 40, abducted 40 without discomfort. The left hip flexes to 95, no internal or external rotation, only about 10 degrees of abduction. The knee exams are normal bilaterally. Pulses sensation motor intact. Gait pattern is antalgic on the left.  RADIOGRAPHS: AP pelvis and lateral of the left hip show advanced endstage arthritis of the left hip, bone on bone near ankylosis and large osteophytes. The right hip is degenerative also, but much lesser degree.  Assessment & Plan Osteoarthritis, Hip (715.35) Impression: Left Hip  Note: Plan is for a Left Total Hip Repalcement - Anterior Approach by Dr. [name].  Plan is to go home.  PCP - Dr. Avva  Signed electronically by DREW L PERKINS, PA-C  

## 2012-03-22 NOTE — Preoperative (Signed)
Beta Blockers   Reason not to administer Beta Blockers:Not Applicable 

## 2012-03-23 ENCOUNTER — Encounter (HOSPITAL_COMMUNITY): Payer: Self-pay | Admitting: Orthopedic Surgery

## 2012-03-23 LAB — CBC
HCT: 40.1 % (ref 39.0–52.0)
Hemoglobin: 13.9 g/dL (ref 13.0–17.0)
MCH: 31.2 pg (ref 26.0–34.0)
MCHC: 34.7 g/dL (ref 30.0–36.0)
MCV: 89.9 fL (ref 78.0–100.0)
Platelets: 165 10*3/uL (ref 150–400)
RBC: 4.46 MIL/uL (ref 4.22–5.81)
RDW: 12.3 % (ref 11.5–15.5)
WBC: 10.6 10*3/uL — ABNORMAL HIGH (ref 4.0–10.5)

## 2012-03-23 LAB — BASIC METABOLIC PANEL
BUN: 13 mg/dL (ref 6–23)
CO2: 29 mEq/L (ref 19–32)
Calcium: 8.5 mg/dL (ref 8.4–10.5)
Chloride: 102 mEq/L (ref 96–112)
Creatinine, Ser: 0.99 mg/dL (ref 0.50–1.35)
GFR calc Af Amer: >90 mL/min (ref >90)
GFR calc non Af Amer: >90 mL/min (ref >90)
Glucose, Bld: 113 mg/dL — ABNORMAL HIGH (ref 70–99)
Potassium: 4.1 mEq/L (ref 3.5–5.1)
Sodium: 139 mEq/L (ref 135–145)

## 2012-03-23 MED ORDER — METHOCARBAMOL 500 MG PO TABS: 500.0000 mg | ORAL_TABLET | Freq: Four times a day (QID) | ORAL | Status: DC | PRN

## 2012-03-23 MED ORDER — OXYCODONE HCL 5 MG PO TABS: 5.0000 mg | ORAL_TABLET | ORAL | Status: DC | PRN

## 2012-03-23 MED ORDER — ENOXAPARIN SODIUM 40 MG/0.4ML ~~LOC~~ SOLN: 40.0000 mg | SUBCUTANEOUS | Status: DC

## 2012-03-23 MED ORDER — TRAMADOL HCL 50 MG PO TABS: 50.0000 mg | ORAL_TABLET | Freq: Four times a day (QID) | ORAL | Status: DC | PRN

## 2012-03-23 NOTE — Progress Notes (Signed)
Dangled pt on edge of bed for approximately 10 minutes, tolerated very well. Assisted him to [facility] as well with 1+ assist using walker with no problems. C/o minimal pain during ambulating and sitting.

## 2012-03-23 NOTE — Progress Notes (Signed)
Physical Therapy Evaluation Patient Details Name: Alexander Carney MRN: [id number] DOB: 1960/09/27 Today's Date: 03/23/2012 Time: 0828-0903 PT Time Calculation (min): 35 min  PT Assessment / Plan / Recommendation Clinical Impression  Pt is 51 yo male s/p left THA who is mobilzing well post-op day 1 and will likely be ready for d/c tomorrow if medically ready. Pt ambulated 150' with RW and min A. PT will follow acutely to begin strengthening and progression of safe mobility for d/c home. Recommend HHPT and RW at d/c.    PT Assessment  Patient needs continued PT services    Follow Up Recommendations  Home health PT;Supervision - Intermittent    Does the patient have the potential to tolerate intense rehabilitation      Barriers to Discharge None      Equipment Recommendations  Rolling walker with 5" wheels    Recommendations for Other Services     Frequency 7X/week    Precautions / Restrictions Precautions Precautions: Anterior Hip Precaution Booklet Issued: Yes (comment) Precaution Comments: pt verbalized understanding of precautions Restrictions Weight Bearing Restrictions: Yes LLE Weight Bearing: Weight bearing as tolerated   Pertinent Vitals/Pain 2/10 left hip pain, premedicated      Mobility  Bed Mobility Bed Mobility: Rolling Right;Right Sidelying to Sit;Sitting - Scoot to Edge of Bed Rolling Right: With rail;4: Min guard Right Sidelying to Sit: 4: Min guard;With rails Sitting - Scoot to Edge of Bed: 5: Supervision Details for Bed Mobility Assistance: vc's for sequencing and precautions with mvmt within the bed Transfers Transfers: Sit to Stand;Stand to Sit Sit to Stand: 4: Min guard;From bed;With upper extremity assist Stand to Sit: 4: Min guard;To chair/3-in-1;With armrests Details for Transfer Assistance: vc's for hand placement and discussed wt-bearing status as well Ambulation/Gait Ambulation/Gait Assistance: 4: Min assist Ambulation Distance (Feet): 150  Feet Assistive device: Rolling walker Ambulation/Gait Assistance Details: vc's for use of RW and sequencing gait for pain control as well as letting left knee bend with swing-through and being aware of normal heel-toe pattern as he progresses with mobility. Striking the floor with flat left foot today Gait Pattern: Step-through pattern;Decreased stance time - left;Decreased step length - left;Decreased hip/knee flexion - left;Left foot flat Stairs: No Wheelchair Mobility Wheelchair Mobility: No    Shoulder Instructions     Exercises Total Joint Exercises Ankle Circles/Pumps: AROM;Both;10 reps;Seated Quad Sets: AROM;Both;10 reps;Seated Gluteal Sets: AROM;10 reps;Both;Seated   PT Diagnosis: Abnormality of gait;Acute pain  PT Problem List: Decreased strength;Decreased mobility;Decreased knowledge of use of DME;Decreased knowledge of precautions;Pain PT Treatment Interventions: DME instruction;Gait training;Stair training;Functional mobility training;Therapeutic activities;Therapeutic exercise;Patient/family education   PT Goals Acute Rehab PT Goals PT Goal Formulation: With patient Time For Goal Achievement: 03/30/12 Potential to Achieve Goals: Good Pt will go Supine/Side to Sit: with modified independence;with HOB 0 degrees PT Goal: Supine/Side to Sit - Progress: Goal set today Pt will go Sit to Supine/Side: with modified independence PT Goal: Sit to Supine/Side - Progress: Goal set today Pt will go Sit to Stand: with modified independence PT Goal: Sit to Stand - Progress: Goal set today Pt will go Stand to Sit: with modified independence PT Goal: Stand to Sit - Progress: Goal set today Pt will Ambulate: >150 feet;with modified independence;with rolling walker PT Goal: Ambulate - Progress: Goal set today Pt will Go Up / Down Stairs: 1-2 stairs;with rolling walker;with supervision PT Goal: Up/Down Stairs - Progress: Goal set today Pt will Perform Home Exercise Program:  Independently PT Goal: Perform Home Exercise Program -  Progress: Goal set today Additional Goals Additional Goal #1: Pt will verbalize 3/3/ anterior hip precautions and keep with mobility PT Goal: Additional Goal #1 - Progress: Goal set today  Visit Information  Last PT Received On: 03/23/12 Assistance Needed: +1    Subjective Data  Subjective: I felt a little nauseous when I sat EOB yesterday Patient Stated Goal: return to home and work   Prior Functioning  Home Living Lives With: Spouse Available Help at Discharge: Family;Available 24 hours/day;Other (Comment) (for a few days) Type of Home: House Home Access: Stairs to enter [employer] of Steps: 4 (only 1 at front) Entrance Stairs-Rails: None Home Layout: Two level;Able to live on main level with bedroom/bathroom Alternate Level Stairs-Number of Steps: 16 Alternate Level Stairs-Rails: Right Home Adaptive Equipment: None Additional Comments: pt's wife is a nurse in short stay, she is taking time off when he first goes home Prior Function Level of Independence: Independent Able to Take Stairs?: Reciprically Driving: Yes Vocation: Full time employment 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, realizes that 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 do that anytime soon Communication Communication: No difficulties    Cognition  Overall Cognitive Status: Appears within functional limits for tasks assessed/performed Arousal/Alertness: Awake/alert Orientation Level: Oriented X4 / Intact Behavior During Session: [facility] for tasks performed    Extremity/Trunk Assessment Right Upper Extremity Assessment RUE ROM/Strength/Tone: Within functional levels RUE Sensation: WFL - Light Touch RUE Coordination: WFL - gross/fine motor Left Upper Extremity Assessment LUE ROM/Strength/Tone: Within functional levels LUE Sensation: WFL - Light Touch LUE Coordination: WFL - gross/fine motor Right Lower Extremity  Assessment RLE ROM/Strength/Tone: Within functional levels RLE Sensation: WFL - Light Touch;WFL - Proprioception RLE Coordination: WFL - gross/fine motor Left Lower Extremity Assessment LLE ROM/Strength/Tone: Deficits LLE ROM/Strength/Tone Deficits: quad 3+/5 though pt feels post surgical heaviness of left leg and has difficulty activating quad in a wt-bearing position LLE Sensation: WFL - Light Touch;WFL - Proprioception LLE Coordination: WFL - gross motor Trunk Assessment Trunk Assessment: Normal   Balance Balance Balance Assessed: Yes Dynamic Standing Balance Dynamic Standing - Balance Support: No upper extremity supported;During functional activity Dynamic Standing - Level of Assistance: 5: Stand by assistance  End of Session PT - End of Session Equipment Utilized During Treatment: Gait belt Activity Tolerance: Patient tolerated treatment well Patient left: in chair;with call bell/phone within reach;with family/visitor present Nurse Communication: Mobility status  GP    Alexander Carney, PT  Acute Rehab Services  [phone]  Alexander Carney 03/23/2012, 9:14 AM

## 2012-03-23 NOTE — Evaluation (Signed)
Occupational Therapy Evaluation Patient Details Name: Alexander Carney MRN: [id number] DOB: Oct 12, 1960 Today's Date: 03/23/2012 Time: 1478-2956 OT Time Calculation (min): 16 min  OT Assessment / Plan / Recommendation Clinical Impression  Pt s/p L THA.  Will benefit from acute OT to address below problem list in prep for d/c home.    OT Assessment  Patient needs continued OT Services    Follow Up Recommendations  Supervision/Assistance - 24 hour;No OT follow up    Barriers to Discharge      Equipment Recommendations  3 in 1 bedside comode    Recommendations for Other Services    Frequency  Min 2X/week    Precautions / Restrictions Precautions Precautions: Anterior Hip Precaution Comments: reviewed precautions Restrictions Weight Bearing Restrictions: Yes LLE Weight Bearing: Weight bearing as tolerated   Pertinent Vitals/Pain See vitals    ADL  Eating/Feeding: Performed;Independent Where Assessed - Eating/Feeding: Edge of bed Lower Body Bathing: Simulated;Minimal assistance Where Assessed - Lower Body Bathing: Unsupported sitting Lower Body Dressing: Minimal assistance;Performed Where Assessed - Lower Body Dressing: Unsupported sitting ADL Comments: Session limited due to arrival of meal tray.  Pt min assist with LB dressing tasks but reports his wife will be able to assist at home.      OT Diagnosis: Acute pain;Generalized weakness  OT Problem List: Decreased strength;Decreased activity tolerance;Decreased knowledge of precautions;Decreased knowledge of use of DME or AE;Pain OT Treatment Interventions: Self-care/ADL training;DME and/or AE instruction;Therapeutic activities;Patient/family education   OT Goals Acute Rehab OT Goals OT Goal Formulation: With patient Time For Goal Achievement: 03/30/12 Potential to Achieve Goals: Good ADL Goals Pt Will Transfer to Toilet: with supervision;Ambulation;with DME;Comfort height toilet;Maintaining hip precautions ADL Goal:  Toilet Transfer - Progress: Goal set today Pt Will Perform Tub/Shower Transfer: Shower transfer;with supervision;Ambulation;with DME;Shower seat with back;Maintaining hip precautions ADL Goal: Tub/Shower Transfer - Progress: Goal set today  Visit Information  Last OT Received On: 03/23/12 Assistance Needed: +1    Subjective Data      Prior Functioning     Home Living Lives With: Spouse Available Help at Discharge: Family;Available 24 hours/day;Other (Comment) (for a few days) Type of Home: House Home Access: Stairs to enter [employer] of Steps: 4 (only 1 at front) Entrance Stairs-Rails: None Home Layout: Two level;Able to live on main level with bedroom/bathroom Alternate Level Stairs-Number of Steps: 16 Alternate Level Stairs-Rails: Right Bathroom Shower/Tub: Health visitor: Standard Home Adaptive Equipment: None Additional Comments: pt's wife is a Engineer, civil (consulting) in short stay, she is taking time off when he first goes home Prior Function Level of Independence: Independent Able to Take Stairs?: Reciprically Driving: Yes Vocation: Full time 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, realizes that 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 do that anytime soon Communication Communication: No difficulties         Vision/Perception     Cognition  Overall Cognitive Status: Appears within functional limits for tasks assessed/performed Arousal/Alertness: Awake/alert Orientation Level: Oriented X4 / Intact Behavior During Session: [facility] for tasks performed    Extremity/Trunk Assessment Right Upper Extremity Assessment RUE ROM/Strength/Tone: [facility] for tasks assessed Left Upper Extremity Assessment LUE ROM/Strength/Tone: [facility] for tasks assessed     Mobility Bed Mobility Bed Mobility: Rolling Right;Right Sidelying to Sit;Sitting - Scoot to Edge of Bed Rolling Right: 5: Supervision;With rail Right Sidelying to Sit: 5: Supervision;HOB  flat;With rails Sitting - Scoot to Edge of Bed: 6: Modified independent (Device/Increase time) Sit to Supine: 4:  Min assist;HOB flat Details for Bed Mobility Assistance: vc's for return to bed and keeping precautions, pt needed min A to get legs into bed Transfers Transfers: Not assessed Sit to Stand: 5: Supervision;From bed;With upper extremity assist Stand to Sit: 5: Supervision;To bed;With upper extremity assist Details for Transfer Assistance: pt transferring without physical assist     Shoulder Instructions     Exercise Total Joint Exercises Ankle Circles/Pumps: AROM;Both;10 reps;Seated Long Arc Quad: AROM;Left;10 reps;Seated   Balance Balance Balance Assessed: No   End of Session OT - End of Session Activity Tolerance: Patient tolerated treatment well Patient left: in bed;with call bell/phone within reach Nurse Communication: Patient requests pain meds  GO   03/23/2012 Cipriano Mile OTR/L Pager [phone] Office [phone]   Cipriano Mile 03/23/2012, 5:03 PM

## 2012-03-23 NOTE — Progress Notes (Signed)
Physical Therapy Treatment Patient Details Name: Alexander Carney MRN: [id number] DOB: [birth year] Today's Date: 03/23/2012 Time: 4098-1191 PT Time Calculation (min): 24 min  PT Assessment / Plan / Recommendation Comments on Treatment Session  Pt is progressing well with left THA, ambulated 200' with RW and min-guard A. Pt with gait dysfunction before surgery, expect it will take time to return to normal pattern. PT will continue to follow.    Follow Up Recommendations  Home health PT;Supervision - Intermittent     Does the patient have the potential to tolerate intense rehabilitation     Barriers to Discharge        Equipment Recommendations  Rolling walker with 5" wheels    Recommendations for Other Services    Frequency 7X/week   Plan Discharge plan remains appropriate;Frequency remains appropriate    Precautions / Restrictions Precautions Precautions: Anterior Hip Precaution Comments: reviewed precautions Restrictions Weight Bearing Restrictions: Yes LLE Weight Bearing: Weight bearing as tolerated   Pertinent Vitals/Pain 3/10 left hip pain and c/o distal thigh pain with ambulation, no meds needed at this time    Mobility  Bed Mobility Bed Mobility: Rolling Right;Right Sidelying to Sit;Sitting - Scoot to Edge of Bed Rolling Right: 5: Supervision;With rail Right Sidelying to Sit: 5: Supervision;HOB flat;With rails Sitting - Scoot to Edge of Bed: 6: Modified independent (Device/Increase time) Sit to Supine: 4: Min assist;HOB flat Details for Bed Mobility Assistance: vc's for return to bed and keeping precautions, pt needed min A to get legs into bed Transfers Transfers: Sit to Stand;Stand to Sit Sit to Stand: 5: Supervision;From bed;With upper extremity assist Stand to Sit: 5: Supervision;To bed;With upper extremity assist Details for Transfer Assistance: pt transferring without physical assist Ambulation/Gait Ambulation/Gait Assistance: 4: Min guard Ambulation  Distance (Feet): 200 Feet Assistive device: Rolling walker Ambulation/Gait Assistance Details: pt has ambulated with antalgic pattern for a long time PTA, will take some time to return to normal gait pattern.  Initial gait pattern training initiated. Gait Pattern: Step-through pattern;Decreased stance time - left;Decreased step length - left;Decreased hip/knee flexion - left;Left foot flat Stairs: No Wheelchair Mobility Wheelchair Mobility: No    Exercises Total Joint Exercises Ankle Circles/Pumps: AROM;Both;10 reps;Seated Long Arc Quad: AROM;Left;10 reps;Seated   PT Diagnosis:    PT Problem List:   PT Treatment Interventions:     PT Goals Acute Rehab PT Goals PT Goal Formulation: With patient Time For Goal Achievement: 03/30/12 Potential to Achieve Goals: Good Pt will go Supine/Side to Sit: with modified independence;with HOB 0 degrees PT Goal: Supine/Side to Sit - Progress: Progressing toward goal Pt will go Sit to Supine/Side: with modified independence PT Goal: Sit to Supine/Side - Progress: Progressing toward goal Pt will go Sit to Stand: with modified independence PT Goal: Sit to Stand - Progress: Progressing toward goal Pt will go Stand to Sit: with modified independence PT Goal: Stand to Sit - Progress: Progressing toward goal Pt will Ambulate: >150 feet;with modified independence;with rolling walker PT Goal: Ambulate - Progress: Progressing toward goal Pt will Go Up / Down Stairs: 1-2 stairs;with rolling walker;with supervision PT Goal: Up/Down Stairs - Progress: Progressing toward goal Pt will Perform Home Exercise Program: Independently PT Goal: Perform Home Exercise Program - Progress: Progressing toward goal Additional Goals Additional Goal #1: Pt will verbalize 3/3/ anterior hip precautions and keep with mobility PT Goal: Additional Goal #1 - Progress: Progressing toward goal  Visit Information  Last PT Received On: 03/23/12 Assistance Needed: +1    Subjective  Data  Subjective: pt having some discomfort of the left distal thing with ambulation Patient Stated Goal: return to home and work   Cognition  Overall Cognitive Status: Appears within functional limits for tasks assessed/performed Arousal/Alertness: Awake/alert Orientation Level: Oriented X4 / Intact Behavior During Session: [facility] for tasks performed    Balance  Balance Balance Assessed: No  End of Session PT - End of Session Equipment Utilized During Treatment: Gait belt Activity Tolerance: Patient tolerated treatment well Patient left: in bed;with call bell/phone within reach Nurse Communication: Mobility status   GP   Lyanne Co, PT  Acute Rehab Services  [phone]   Lyanne Co 03/23/2012, 3:26 PM

## 2012-03-23 NOTE — Progress Notes (Signed)
   Subjective: 1 Day Post-Op Procedure(s) (LRB): TOTAL HIP ARTHROPLASTY ANTERIOR APPROACH (Left) Patient reports pain as mild.   Patient is recently admitted for anterior hip replacement We will start therapy today.  Plan is to go Home after hospital stay.  Objective: Vital signs in last 24 hours: Temp:  [97.5 F (36.4 C)-98.5 F (36.9 C)] 98.1 F (36.7 C) (12/14 1191) Pulse Rate:  [60-84] 80  (12/14 0608) Resp:  [17-31] 18  (12/14 0608) BP: (116-146)/(47-74) 129/71 mmHg (12/14 0608) SpO2:  [96 %-100 %] 98 % (12/14 4782) Weight:  [225 lb 1.4 oz (102.1 kg)] 225 lb 1.4 oz (102.1 kg) (12/13 1700)  Intake/Output from previous day:  Intake/Output Summary (Last 24 hours) at 03/23/12 0940 Last data filed at 03/23/12 9562  Gross per 24 hour  Intake   3680 ml  Output   3055 ml  Net    625 ml    Intake/Output this shift:    Labs:  Basename 03/23/12 0545  HGB 13.9    Basename 03/23/12 0545  WBC 10.6*  RBC 4.46  HCT 40.1  PLT 165    Basename 03/23/12 0545  NA 139  K 4.1  CL 102  CO2 29  BUN 13  CREATININE 0.99  GLUCOSE 113*  CALCIUM 8.5   No results found for this basename: LABPT:2,INR:2 in the last 72 hours  EXAM General - Patient is Alert, Appropriate and Oriented Extremity - Neurologically intact Neurovascular intact Dorsiflexion/Plantar flexion intact No cellulitis present Compartment soft Dressing - dressing C/D/I Motor Function - intact, moving foot and toes well on exam.  Hemovac pulled without difficulty.  Past Medical History  Diagnosis Date  . Arthritis     left hip osteoarthritis  . Closed dislocation of knee, unspecified part 1980's    left x2  . Hyperlipidemia     takes Lipitor daily  . Seizures     last one in 01/2007;takes Tegretol daily  . Headache     occasionally  . Joint pain   . Right cataract     immature    Assessment/Plan: 1 Day Post-Op Procedure(s) (LRB): TOTAL HIP ARTHROPLASTY ANTERIOR APPROACH (Left) Principal  Problem:  *OA (osteoarthritis) of hip   Advance diet Up with therapy D/C IV fluids Plan for discharge tomorrow  DVT Prophylaxis - Lovenox Weight Bearing As Tolerated left Leg D/C Knee Immobilizer Hemovac Pulled Begin Therapy Hip Preacutions   Loanne Drilling

## 2012-03-23 NOTE — Op Note (Signed)
NAMEWALTHER, SANAGUSTIN NO.:  [id number]  MEDICAL RECORD NO.:  [id number]  LOCATION:  [id number]                        FACILITY:  [facility]  PHYSICIAN:  Ollen Gross, M.D.    DATE OF BIRTH:  [birth year]  DATE OF PROCEDURE:  03/22/2012 DATE OF DISCHARGE:                              OPERATIVE REPORT   PREOPERATIVE DIAGNOSIS:  Osteoarthritis, left hip.  POSTOPERATIVE DIAGNOSIS:  Osteoarthritis, left hip.  PROCEDURE:  Left total hip arthroplasty, anterior approach.  SURGEON:  Ollen Gross, M.D.  ASSISTANT:  Dimitri Ped, PA-C.  ANESTHESIA:  General.  ESTIMATED BLOOD LOSS:  250 mL.  DRAINS:  Hemovac x1.  COMPLICATIONS:  None.  CONDITION:  Stable to recovery.  BRIEF CLINICAL NOTE:  Alexander Carney is a 51 year old male with advanced end- stage arthritis of his left hip with progressively worsening pain and dysfunction.  He has bone-on-bone arthritis with large osteophytes present.  He presents now for left total hip arthroplasty.  PROCEDURE IN DETAIL:  After successful administration of general anesthetic, the patient's feet are placed into the traction boots and he is subsequently placed onto the [facility] bed.  He had radiographs then taken AP hip and AP pelvis with fluoroscopy to serve as baseline for utilization later in the procedure.  His thigh was then isolated from his trunk and perineum with plastic drapes and prepped and draped in usual sterile fashion.  ASIS and greater trochanter were marked. Incision line was drawn starting 2 cm distal, 2 cm lateral to the ASIS and coursing towards the anterior aspect of the femur in oblique fashion.  The incision was made with a 10 blade through subcutaneous tissue to the fascia overlying the tensor fascia lata muscle.  This fascia was incised in line with the skin incision.  Muscle was teased off the fascia to enter the interval between the tensor fascia lata and rectus femoris muscles.  Retraction was placed to open this  interval and to gain access to the anterior hip capsule.  The ascending branch of the lateral femoral circumflex and the flex vessel was identified and cauterized.  The pre-capsular fat was then removed.  Capsulotomy was performed in a L shape to gain access to the hip joint.  Capsule was tagged and retracted.  Retractors were then placed on the superior- inferior aspect of the femoral neck and the osteotomy line was marked on the femoral neck and osteotomy made with an oscillating saw.  The corkscrew was then placed into the femoral head and the femoral head removed.  Gentle traction was applied to the femur and acetabular retractors were placed to gain excellent exposure of the acetabulum.  He had a large rim of osteophytes, which was removed.  Acetabular labrum was subsequently removed also.  The acetabular reaming starts at 47 mm coursing in increments of 53 mm.  A 54 mm pinnacle acetabular shell was impacted in anatomic position with excellent purchase.  The position was confirmed to be excellent with AP fluoroscopy.  We did not need to put any dome screws due to the purchase of the cup.  The 36 mm neutral +4 marathon liner was then impacted and locked into position.  Femur was placed  into neutral position and traction was removed.  The proximal femoral hook was placed around the proximal femur and then the leg was slowly externally rotated while the capsule was further released off the calcar.  We easily obtained 90 degrees external rotation and applied elevation to the femoral elevator until it was appropriately tensed.  The leg was then placed in adducted and externally rotated position with the hip in extension.  We got retractors around the calcar and then also released the capsule from around the piriformis fossa area.  I then placed the starter chili pepper into the canal.  We started broaching with a size 8 going up to size 15.  Had excellent torsional and rotational  stability with a size 15.  We then took x-ray AP and lateral showing excellent fill of the canal.  We then placed the femoral neck and a 36+ 1 trial head.  The hip was then reduced with excellent stability.  The AP pelvis was taken and were about 3-4 mm short so went to a +5 head, which led to equalization of the leg lengths.  The hip was then dislocated and trials removed.  We then went back into an extended abducted and external rotated position, and placed the size 15 [place] standard offset stem with excellent purchase in the canal. The 36+ 5 ceramic head was then placed.  The hip was brought up to neutral position and internally rotated with retraction in order to reduce it.  We had great stability past 90 degrees of external rotation and extension.  AP pelvis was again taken showing that the leg lengths are equal, we had excellent fill of the femoral canal with the femoral prosthesis.  Wound was then copiously irrigated with saline solution. Capsular repair repaired with Ethibond suture.  20 mL of Exparel mixed with 50 mL of saline were injected into the capsule, tensor fascia lata, rectus femoris muscle, subcu tissues and fascia.  The Hemovac drain was then placed and the fascia closed with a running #1 V-Loc suture.  Subcu was closed with interrupted 2-0 Vicryl and subcuticular running 4-0 Monocryl.  Incision was cleaned and dried and Steri-Strips, bulky sterile dressing applied.  He was then awakened and transported to recovery in stable condition.     Ollen Gross, M.D.     FA/MEDQ  D:  03/22/2012  T:  03/23/2012  Job:  [id number]

## 2012-03-24 LAB — BASIC METABOLIC PANEL
BUN: 12 mg/dL (ref 6–23)
Creatinine, Ser: 0.87 mg/dL (ref 0.50–1.35)
GFR calc non Af Amer: >90 mL/min (ref >90)
Glucose, Bld: 119 mg/dL — ABNORMAL HIGH (ref 70–99)
Potassium: 4.2 mEq/L (ref 3.5–5.1)

## 2012-03-24 LAB — CBC
HCT: 38.8 % — ABNORMAL LOW (ref 39.0–52.0)
Hemoglobin: 13.2 g/dL (ref 13.0–17.0)
MCHC: 34 g/dL (ref 30.0–36.0)
MCV: 91.3 fL (ref 78.0–100.0)

## 2012-03-24 NOTE — Progress Notes (Signed)
PT Cancellation Note  Patient Details Name: Alexander Carney MRN: [id number] DOB: [birth year]   Cancelled Treatment:    Pt politely refusing at this time due to just worked with OT and is in pain, has not had any pain meds this am. RN has been notified and is getting pt pain meds. Pt is agreeable to work with PT after has pain meds and time allowed for medicine to work. Will follow up later today as time allows.  Sallyanne Kuster 03/24/2012, 10:12 AM  Sallyanne Kuster, PTA Office- [phone]

## 2012-03-24 NOTE — Progress Notes (Signed)
Subjective: 2 Days Post-Op Procedure(s) (LRB): TOTAL HIP ARTHROPLASTY ANTERIOR APPROACH (Left) Patient reports pain as better.  Has not passed PT yet. Would like to go home tomorrow   Objective: Vital signs in last 24 hours: Temp:  [98.2 F (36.8 C)-98.9 F (37.2 C)] 98.9 F (37.2 C) (12/15 0522) Pulse Rate:  [80-105] 105  (12/15 0522) Resp:  [16-22] 18  (12/15 0522) BP: (136-150)/(80-83) 136/83 mmHg (12/15 0522) SpO2:  [96 %-98 %] 96 % (12/15 0522)  Intake/Output from previous day: 12/14 0701 - 12/15 0700 In: 600 [P.O.:600] Out: -  Intake/Output this shift:     Basename 03/24/12 0510 03/23/12 0545  HGB 13.2 13.9    Basename 03/24/12 0510 03/23/12 0545  WBC 12.8* 10.6*  RBC 4.25 4.46  HCT 38.8* 40.1  PLT 161 165    Basename 03/24/12 0510 03/23/12 0545  NA 137 139  K 4.2 4.1  CL 101 102  CO2 27 29  BUN 12 13  CREATININE 0.87 0.99  GLUCOSE 119* 113*  CALCIUM 8.8 8.5   No results found for this basename: LABPT:2,INR:2 in the last 72 hours  Wound clean, dry, intact with no erythema. No change neurovascular status No calf tenderness  Assessment/Plan: 2 Days Post-Op Procedure(s) (LRB): TOTAL HIP ARTHROPLASTY ANTERIOR APPROACH (Left) Discharge home with home health tomorrow  Continue with PT  Geoffery Aultman A 03/24/2012, 11:55 AM

## 2012-03-24 NOTE — Progress Notes (Signed)
Occupational Therapy Treatment Patient Details Name: Alexander Carney MRN: [id number] DOB: 04/06/1961 Today's Date: 03/24/2012 Time: 4098-1191 OT Time Calculation (min): 14 min  OT Assessment / Plan / Recommendation Comments on Treatment Session Pt has met goals. Anticipate d/c home today. Signing off.    Follow Up Recommendations  Supervision/Assistance - 24 hour;No OT follow up    Barriers to Discharge       Equipment Recommendations  3 in 1 bedside comode    Recommendations for Other Services    Frequency Min 2X/week   Plan Discharge plan remains appropriate;All goals met and education completed, patient discharged from OT services    Precautions / Restrictions Precautions Precautions: Anterior Hip Precaution Booklet Issued: Yes (comment) Precaution Comments: reviewed precautions Restrictions Weight Bearing Restrictions: Yes LLE Weight Bearing: Weight bearing as tolerated   Pertinent Vitals/Pain See vitals    ADL  Lower Body Dressing: Performed;Minimal assistance Where Assessed - Lower Body Dressing: Unsupported sitting Toilet Transfer: Performed;Supervision/safety Toilet Transfer Method: Sit to stand Toilet Transfer Equipment: Raised toilet seat with arms (or 3-in-1 over toilet) Tub/Shower Transfer: Landscape architect Method: Science writer: Walk in Scientist, research (physical sciences) Used: Rolling walker Transfers/Ambulation Related to ADLs: supervision with RW ADL Comments: Educated pt on maintaining hip precautions during toilet and shower transfer (leading with right LE when backing up and turning toward right side in order to avoid pointing left foot out).  Wife present throughout session and also aware of precautions and techniques.  Wife independent in assisting pt with ADLs and transfers.    OT Diagnosis:    OT Problem List:   OT Treatment Interventions:     OT Goals ADL Goals Pt Will Transfer to Toilet: with  supervision;Ambulation;with DME;Comfort height toilet;Maintaining hip precautions ADL Goal: Toilet Transfer - Progress: Met Pt Will Perform Tub/Shower Transfer: Shower transfer;with supervision;Ambulation;with DME;Shower seat with back;Maintaining hip precautions ADL Goal: Tub/Shower Transfer - Progress: Met  Visit Information  Last OT Received On: 03/24/12 Assistance Needed: +1    Subjective Data      Prior Functioning       Cognition  Overall Cognitive Status: Appears within functional limits for tasks assessed/performed Arousal/Alertness: Awake/alert Orientation Level: Oriented X4 / Intact Behavior During Session: [facility] for tasks performed    Mobility  Shoulder Instructions Bed Mobility Bed Mobility: Not assessed Transfers Transfers: Sit to Stand;Stand to Sit Sit to Stand: 5: Supervision;From chair/3-in-1;With upper extremity assist;With armrests Stand to Sit: 5: Supervision;To chair/3-in-1;With armrests;With upper extremity assist Details for Transfer Assistance: VC for safest hand plaement. Pt attempting to pull up on RW for sit<>stand but cued to push up from armrests.       Exercises      Balance     End of Session OT - End of Session Equipment Utilized During Treatment:  (RW) Activity Tolerance: Patient tolerated treatment well Patient left: in chair;with call bell/phone within reach;with family/visitor present Nurse Communication: Patient requests pain meds  GO   03/24/2012 Cipriano Mile OTR/L Pager [phone] Office [phone]   Cipriano Mile 03/24/2012, 9:52 AM

## 2012-03-24 NOTE — Progress Notes (Signed)
Physical Therapy Treatment Patient Details Name: Alexander Carney MRN: [id number] DOB: 1960-06-24 Today's Date: 03/24/2012 Time: 4098-1191 PT Time Calculation (min): 23 min  PT Assessment / Plan / Recommendation Comments on Treatment Session  Pt cont's to progress well with mobility & PT goals at this date.  Performed 1 step today (mimics front entrance to home).      Follow Up Recommendations  Home health PT;Supervision - Intermittent     Does the patient have the potential to tolerate intense rehabilitation     Barriers to Discharge        Equipment Recommendations  Rolling walker with 5" wheels    Recommendations for Other Services    Frequency 7X/week   Plan Discharge plan remains appropriate;Frequency remains appropriate    Precautions / Restrictions Precautions Precautions: Anterior Hip Precaution Comments: Pt able to (I)'ly recall all 3 hip precautions Restrictions LLE Weight Bearing: Weight bearing as tolerated   Pertinent Vitals/Pain 2/10  Lt hip.     Mobility  Bed Mobility Bed Mobility: Sitting - Scoot to Edge of Bed;Rolling Right;Right Sidelying to Sit Rolling Right: 6: Modified independent (Device/Increase time);With rail Right Sidelying to Sit: 6: Modified independent (Device/Increase time);With rails;HOB flat Sitting - Scoot to Edge of Bed: 6: Modified independent (Device/Increase time) Transfers Transfers: Sit to Stand;Stand to Sit Sit to Stand: 5: Supervision;With upper extremity assist;From bed Stand to Sit: 5: Supervision;With upper extremity assist;With armrests;To chair/3-in-1 Ambulation/Gait Ambulation/Gait Assistance: 4: Min guard Ambulation Distance (Feet): 300 Feet Assistive device: Rolling walker Ambulation/Gait Assistance Details: Cues for sequencing & for step-to pattern rather than step-through Gait Pattern: Step-to pattern Stairs: Yes Stairs Assistance: 4: Min guard Stairs Assistance Details (indicate cue type and reason): Cues for  sequencing & technique Stair Management Technique: No rails;Step to pattern;Forwards;With walker Number of Stairs: 1  (2x's) Wheelchair Mobility Wheelchair Mobility: No      PT Goals Acute Rehab PT Goals Time For Goal Achievement: 03/30/12 Potential to Achieve Goals: Good Pt will go Supine/Side to Sit: with modified independence;with HOB 0 degrees PT Goal: Supine/Side to Sit - Progress: Met Pt will go Sit to Supine/Side: with modified independence Pt will go Sit to Stand: with modified independence PT Goal: Sit to Stand - Progress: Progressing toward goal Pt will go Stand to Sit: with modified independence PT Goal: Stand to Sit - Progress: Progressing toward goal Pt will Ambulate: >150 feet;with modified independence;with rolling walker PT Goal: Ambulate - Progress: Progressing toward goal Pt will Go Up / Down Stairs: 1-2 stairs;with rolling walker;with supervision PT Goal: Up/Down Stairs - Progress: Progressing toward goal Pt will Perform Home Exercise Program: Independently Additional Goals Additional Goal #1: Pt will verbalize 3/3/ anterior hip precautions and keep with mobility PT Goal: Additional Goal #1 - Progress: Progressing toward goal  Visit Information  Last PT Received On: 03/24/12 Assistance Needed: +1    Subjective Data      Cognition  Overall Cognitive Status: Appears within functional limits for tasks assessed/performed Arousal/Alertness: Awake/alert Orientation Level: Oriented X4 / Intact Behavior During Session: [facility] for tasks performed    Balance     End of Session PT - End of Session Equipment Utilized During Treatment: Gait belt Activity Tolerance: Patient tolerated treatment well Patient left: in chair;with call bell/phone within reach;with family/visitor present     Verdell Face, PTA [phone] 03/24/2012

## 2012-03-25 ENCOUNTER — Encounter (HOSPITAL_COMMUNITY): Payer: Self-pay | Admitting: *Deleted

## 2012-03-25 LAB — CBC
Hemoglobin: 12.9 g/dL — ABNORMAL LOW (ref 13.0–17.0)
MCHC: 34.4 g/dL (ref 30.0–36.0)
Platelets: 155 10*3/uL (ref 150–400)
RDW: 12.1 % (ref 11.5–15.5)

## 2012-03-25 NOTE — Care Management Note (Signed)
    Page 1 of 2   03/25/2012     11:26:10 AM   CARE MANAGEMENT NOTE 03/25/2012  Patient:  Alexander Carney, Alexander Carney   Account Number:  [id number]  Date Initiated:  03/25/2012  Documentation initiated by:  Anette Guarneri  Subjective/Objective Assessment:   Left THA  will need HH services and DME     Action/Plan:   Home with [facility] services   Anticipated DC Date:  03/25/2012   Anticipated DC Plan:  HOME W HOME HEALTH SERVICES      DC Planning Services  CM consult      [facility] Choice  DURABLE MEDICAL EQUIPMENT  HOME HEALTH   Choice offered to / List presented to:  C-1 Patient   DME arranged  3-N-1  Levan Hurst      DME agency  [facility]     HH arranged  HH-2 PT      [facility] agency  [facility]   Status of service:  Completed, signed off Medicare Important Message given?  NO (If response is "NO", the following Medicare IM given date fields will be blank) Date Medicare IM given:   Date Additional Medicare IM given:    Discharge Disposition:  HOME W HOME HEALTH SERVICES  Per UR Regulation:  Reviewed for med. necessity/level of care/duration of stay  If discussed at Long Length of Stay Meetings, dates discussed:    Comments:  03/25/12 11:23 Anette Guarneri RN/CM Spoke with patient regarding d/c needs. Patient choice for [facility] services is [facility] Contacted [facility], HHPT to begin after d/c home Patient needs RW/3n1, order submitted for both to be delivered to room prior to discharge.

## 2012-03-25 NOTE — Progress Notes (Signed)
   Subjective: 3 Days Post-Op Procedure(s) (LRB): TOTAL HIP ARTHROPLASTY ANTERIOR APPROACH (Left) Patient reports pain as mild.   Patient seen in rounds with Dr. Lequita Halt. Patient is well, and has had no acute complaints or problems Patient is ready to go home.  Objective: Vital signs in last 24 hours: Temp:  [98.3 F (36.8 C)-99.2 F (37.3 C)] 98.3 F (36.8 C) (12/15 2107) Pulse Rate:  [85] 85  (12/15 1535) Resp:  [16-20] 16  (12/16 0400) BP: (145-161)/(78-79) 161/79 mmHg (12/15 2107) SpO2:  [97 %-99 %] 99 % (12/16 0400)  Intake/Output from previous day:  Intake/Output Summary (Last 24 hours) at 03/25/12 0622 Last data filed at 03/24/12 2000  Gross per 24 hour  Intake    800 ml  Output      0 ml  Net    800 ml    Intake/Output this shift: Total I/O In: 240 [P.O.:240] Out: -   Labs:  Basename 03/24/12 0510 03/23/12 0545  HGB 13.2 13.9    Basename 03/24/12 0510 03/23/12 0545  WBC 12.8* 10.6*  RBC 4.25 4.46  HCT 38.8* 40.1  PLT 161 165    Basename 03/24/12 0510 03/23/12 0545  NA 137 139  K 4.2 4.1  CL 101 102  CO2 27 29  BUN 12 13  CREATININE 0.87 0.99  GLUCOSE 119* 113*  CALCIUM 8.8 8.5   No results found for this basename: LABPT:2,INR:2 in the last 72 hours  EXAM: General - Patient is Alert, Appropriate and Oriented Extremity - Neurovascular intact Sensation intact distally Dorsiflexion/Plantar flexion intact No cellulitis present Incision - clean, dry, no drainage, healing Motor Function - intact, moving foot and toes well on exam.   Assessment/Plan: 3 Days Post-Op Procedure(s) (LRB): TOTAL HIP ARTHROPLASTY ANTERIOR APPROACH (Left) Procedure(s) (LRB): TOTAL HIP ARTHROPLASTY ANTERIOR APPROACH (Left) Past Medical History  Diagnosis Date  . Arthritis     left hip osteoarthritis  . Closed dislocation of knee, unspecified part 1980's    left x2  . Hyperlipidemia     takes Lipitor daily  . Seizures     last one in 01/2007;takes Tegretol  daily  . Headache     occasionally  . Joint pain   . Right cataract     immature   Principal Problem:  *OA (osteoarthritis) of hip  Estimated Body mass index is 32.30 kg/(m^2) as calculated from the following:   Height as of this encounter: 5\' 10" (1.778 m).   Weight as of this encounter: 225 lb 1.4 oz(102.1 kg). Discharge home with home health Diet - Regular diet Follow up - on Dec 23rd Activity - WBAT Disposition - Home Condition Upon Discharge - Good D/C Meds - See DC Summary DVT Prophylaxis - Lovenox  Quinnetta Roepke 03/25/2012, 6:22 AM

## 2012-03-25 NOTE — Progress Notes (Signed)
Utilization review completed. Rubena Roseman, RN, BSN. 

## 2012-03-25 NOTE — Progress Notes (Signed)
Physical Therapy Treatment Patient Details Name: Alexander Carney MRN: [id number] DOB: Aug 13, 1960 Today's Date: 03/25/2012 Time: 2130-8657 PT Time Calculation (min): 24 min  PT Assessment / Plan / Recommendation Comments on Treatment Session  pt presents with L THA.  pt movign well and eager to D/C to home.  pt will have great help at home and ready to D/C from PT stand point.      Follow Up Recommendations  Home health PT;Supervision - Intermittent     Does the patient have the potential to tolerate intense rehabilitation     Barriers to Discharge        Equipment Recommendations  Rolling walker with 5" wheels    Recommendations for Other Services    Frequency 7X/week   Plan Discharge plan remains appropriate;Frequency remains appropriate    Precautions / Restrictions Precautions Precautions: Anterior Hip Precaution Comments: Pt able to (I)'ly recall all 3 hip precautions Restrictions Weight Bearing Restrictions: Yes LLE Weight Bearing: Weight bearing as tolerated   Pertinent Vitals/Pain Denies pain.      Mobility  Bed Mobility Bed Mobility: Not assessed Transfers Transfers: Sit to Stand;Stand to Sit Sit to Stand: 5: Supervision;With upper extremity assist;From chair/3-in-1;With armrests Stand to Sit: 5: Supervision;With upper extremity assist;To chair/3-in-1;With armrests Details for Transfer Assistance: Demos good use of UEs.   Ambulation/Gait Ambulation/Gait Assistance: 5: Supervision Ambulation Distance (Feet): 300 Feet Assistive device: Rolling walker Ambulation/Gait Assistance Details: cues for decreasing R step length and upright posture.   Gait Pattern: Step-to pattern Stairs: Yes Stairs Assistance: 4: Min guard Stairs Assistance Details (indicate cue type and reason): cues to slow down, but demos good technique.   Stair Management Technique: No rails;Step to pattern;Forwards;With walker Number of Stairs: 1  (x2) Wheelchair Mobility Wheelchair Mobility:  No    Exercises     PT Diagnosis:    PT Problem List:   PT Treatment Interventions:     PT Goals Acute Rehab PT Goals Time For Goal Achievement: 03/30/12 Potential to Achieve Goals: Good PT Goal: Sit to Stand - Progress: Progressing toward goal PT Goal: Stand to Sit - Progress: Progressing toward goal PT Goal: Ambulate - Progress: Progressing toward goal PT Goal: Up/Down Stairs - Progress: Progressing toward goal Additional Goals PT Goal: Additional Goal #1 - Progress: Partly met  Visit Information  Last PT Received On: 03/25/12 Assistance Needed: +1    Subjective Data  Subjective: pt ready to go home today.     Cognition  Overall Cognitive Status: Appears within functional limits for tasks assessed/performed Arousal/Alertness: Awake/alert Orientation Level: Oriented X4 / Intact Behavior During Session: [facility] for tasks performed    Balance  Balance Balance Assessed: No  End of Session PT - End of Session Equipment Utilized During Treatment: Gait belt Activity Tolerance: Patient tolerated treatment well Patient left: in chair;with call bell/phone within reach Nurse Communication: Mobility status   GP     Sunny Schlein,  [phone] 03/25/2012, 10:23 AM

## 2012-03-25 NOTE — Discharge Summary (Signed)
Physician Discharge Summary   Patient ID: Alexander Carney MRN: [id number] DOB/AGE: 06-11-1960 51 y.o.  Admit date: 03/22/2012 Discharge date: 03/25/2012  Primary Diagnosis: Osteoarthritis, left hip.   Admission Diagnoses:  Past Medical History  Diagnosis Date  . Arthritis     left hip osteoarthritis  . Closed dislocation of knee, unspecified part 1980's    left x2  . Hyperlipidemia     takes Lipitor daily  . Seizures     last one in 01/2007;takes Tegretol daily  . Headache     occasionally  . Joint pain   . Right cataract     immature   Discharge Diagnoses:   Principal Problem:  *OA (osteoarthritis) of hip  Estimated Body mass index is 32.30 kg/(m^2) as calculated from the following:   Height as of this encounter: 5\' 10" (1.778 m).   Weight as of this encounter: 225 lb 1.4 oz(102.1 kg).  Classification of overweight in adults according to BMI (WHO, 1998)   Procedure: Procedure(s) (LRB): TOTAL HIP ARTHROPLASTY ANTERIOR APPROACH (Left)   Consults: None  HPI: Alexander Carney is a 51 year old male with advanced end-  stage arthritis of his left hip with progressively worsening pain and  dysfunction. He has bone-on-bone arthritis with large osteophytes  present. He presents now for left total hip arthroplasty.  Laboratory Data: Admission on 03/22/2012  Component Date Value Range Status  . WBC 03/23/2012 10.6* 4.0 - 10.5 K/uL Final  . RBC 03/23/2012 4.46  4.22 - 5.81 MIL/uL Final  . Hemoglobin 03/23/2012 13.9  13.0 - 17.0 g/dL Final  . HCT 13/11/6576 40.1  39.0 - 52.0 % Final  . MCV 03/23/2012 89.9  78.0 - 100.0 fL Final  . MCH 03/23/2012 31.2  26.0 - 34.0 pg Final  . MCHC 03/23/2012 34.7  30.0 - 36.0 g/dL Final  . RDW 46/96/2952 12.3  11.5 - 15.5 % Final  . Platelets 03/23/2012 165  150 - 400 K/uL Final  . Sodium 03/23/2012 139  135 - 145 mEq/L Final  . Potassium 03/23/2012 4.1  3.5 - 5.1 mEq/L Final  . Chloride 03/23/2012 102  96 - 112 mEq/L Final  . CO2 03/23/2012 29   19 - 32 mEq/L Final  . Glucose, Bld 03/23/2012 113* 70 - 99 mg/dL Final  . BUN 84/13/2440 13  6 - 23 mg/dL Final  . Creatinine, Ser 03/23/2012 0.99  0.50 - 1.35 mg/dL Final  . Calcium 02/04/2535 8.5  8.4 - 10.5 mg/dL Final  . GFR calc non Af Amer 03/23/2012 >90  >90 mL/min Final  . GFR calc Af Amer 03/23/2012 >90  >90 mL/min Final   Comment:                                 The eGFR has been calculated                          using the CKD EPI equation.                          This calculation has not been                          validated in all clinical  situations.                          eGFR's persistently                          <90 mL/min signify                          possible Chronic Kidney Disease.  . WBC 03/24/2012 12.8* 4.0 - 10.5 K/uL Final  . RBC 03/24/2012 4.25  4.22 - 5.81 MIL/uL Final  . Hemoglobin 03/24/2012 13.2  13.0 - 17.0 g/dL Final  . HCT 40/98/1191 38.8* 39.0 - 52.0 % Final  . MCV 03/24/2012 91.3  78.0 - 100.0 fL Final  . MCH 03/24/2012 31.1  26.0 - 34.0 pg Final  . MCHC 03/24/2012 34.0  30.0 - 36.0 g/dL Final  . RDW 47/82/9562 12.4  11.5 - 15.5 % Final  . Platelets 03/24/2012 161  150 - 400 K/uL Final  . Sodium 03/24/2012 137  135 - 145 mEq/L Final  . Potassium 03/24/2012 4.2  3.5 - 5.1 mEq/L Final  . Chloride 03/24/2012 101  96 - 112 mEq/L Final  . CO2 03/24/2012 27  19 - 32 mEq/L Final  . Glucose, Bld 03/24/2012 119* 70 - 99 mg/dL Final  . BUN 13/11/6576 12  6 - 23 mg/dL Final  . Creatinine, Ser 03/24/2012 0.87  0.50 - 1.35 mg/dL Final  . Calcium 46/96/2952 8.8  8.4 - 10.5 mg/dL Final  . GFR calc non Af Amer 03/24/2012 >90  >90 mL/min Final  . GFR calc Af Amer 03/24/2012 >90  >90 mL/min Final   Comment:                                 The eGFR has been calculated                          using the CKD EPI equation.                          This calculation has not been                          validated in all clinical                            situations.                          eGFR's persistently                          <90 mL/min signify                          possible Chronic Kidney Disease.  Hospital Outpatient Visit on 03/15/2012  Component Date Value Range Status  . aPTT 03/15/2012 29  24 - 37 seconds Final  . WBC 03/15/2012 6.9  4.0 - 10.5 K/uL Final  . RBC 03/15/2012 4.92  4.22 - 5.81 MIL/uL Final  . Hemoglobin 03/15/2012 15.6  13.0 - 17.0 g/dL Final  . HCT 84/13/2440 43.5  39.0 -  52.0 % Final  . MCV 03/15/2012 88.4  78.0 - 100.0 fL Final  . MCH 03/15/2012 31.7  26.0 - 34.0 pg Final  . MCHC 03/15/2012 35.9  30.0 - 36.0 g/dL Final  . RDW 95/28/4132 12.1  11.5 - 15.5 % Final  . Platelets 03/15/2012 201  150 - 400 K/uL Final  . Sodium 03/15/2012 140  135 - 145 mEq/L Final  . Potassium 03/15/2012 4.0  3.5 - 5.1 mEq/L Final  . Chloride 03/15/2012 104  96 - 112 mEq/L Final  . CO2 03/15/2012 26  19 - 32 mEq/L Final  . Glucose, Bld 03/15/2012 92  70 - 99 mg/dL Final  . BUN 44/04/270 13  6 - 23 mg/dL Final  . Creatinine, Ser 03/15/2012 0.89  0.50 - 1.35 mg/dL Final  . Calcium 53/66/4403 8.9  8.4 - 10.5 mg/dL Final  . Total Protein 03/15/2012 6.7  6.0 - 8.3 g/dL Final  . Albumin 47/42/5956 3.9  3.5 - 5.2 g/dL Final  . AST 38/75/6433 19  0 - 37 U/L Final  . ALT 03/15/2012 25  0 - 53 U/L Final  . Alkaline Phosphatase 03/15/2012 107  39 - 117 U/L Final  . Total Bilirubin 03/15/2012 0.3  0.3 - 1.2 mg/dL Final  . GFR calc non Af Amer 03/15/2012 >90  >90 mL/min Final  . GFR calc Af Amer 03/15/2012 >90  >90 mL/min Final   Comment:                                 The eGFR has been calculated                          using the CKD EPI equation.                          This calculation has not been                          validated in all clinical                          situations.                          eGFR's persistently                          <90 mL/min signify                           possible Chronic Kidney Disease.  Marland Kitchen Prothrombin Time 03/15/2012 13.4  11.6 - 15.2 seconds Final  . INR 03/15/2012 1.03  0.00 - 1.49 Final  . ABO/RH(D) 03/15/2012 B POS   Final  . Antibody Screen 03/15/2012 NEG   Final  . Sample Expiration 03/15/2012 03/29/2012   Final  . Color, Urine 03/15/2012 YELLOW  YELLOW Final  . APPearance 03/15/2012 CLEAR  CLEAR Final  . Specific Gravity, Urine 03/15/2012 1.026  1.005 - 1.030 Final  . pH 03/15/2012 6.0  5.0 - 8.0 Final  . Glucose, UA 03/15/2012 NEGATIVE  NEGATIVE mg/dL Final  . Hgb urine dipstick 03/15/2012 SMALL* NEGATIVE Final  . Bilirubin Urine 03/15/2012 NEGATIVE  NEGATIVE Final  .  Ketones, ur 03/15/2012 NEGATIVE  NEGATIVE mg/dL Final  . Protein, ur 44/06/4740 30* NEGATIVE mg/dL Final  . Urobilinogen, UA 03/15/2012 1.0  0.0 - 1.0 mg/dL Final  . Nitrite 59/56/3875 NEGATIVE  NEGATIVE Final  . Leukocytes, UA 03/15/2012 NEGATIVE  NEGATIVE Final  . MRSA, PCR 03/15/2012 NEGATIVE  NEGATIVE Final  . Staphylococcus aureus 03/15/2012 POSITIVE* NEGATIVE Final   Comment:                                 The Xpert SA Assay (FDA                          approved for NASAL specimens                          in patients over 58 years of age),                          is one component of                          a comprehensive surveillance                          program.  Test performance has                          been validated by [employer] for patients greater                          than or equal to 54 year old.                          It is not intended                          to diagnose infection nor to                          guide or monitor treatment.  . ABO/RH(D) 03/15/2012 B POS   Final  . WBC, UA 03/15/2012 0-2  <3 WBC/hpf Final  . RBC / HPF 03/15/2012 3-6  <3 RBC/hpf Final  . Casts 03/15/2012 HYALINE CASTS* NEGATIVE Final  . Urine-Other 03/15/2012 MUCOUS PRESENT   Final     X-Rays:Dg Hip Complete  Left  03/15/2012  *RADIOLOGY REPORT*  Clinical Data: Preoperative evaluation.  LEFT HIP - COMPLETE 2+ VIEW  Comparison: None.  Findings: There is narrowing of the superior aspect of the hip joint spaces.  This is more severe on the left.  There is marginal osteophyte formation representing osteoarthritic change bilaterally, again more severely on the left than the right.  No fracture or bony destruction is seen.  No calcific bursitis or calcific tendonitis is evident.  There is degenerative spondylosis.  IMPRESSION: Hip osteoarthritic changes are described above.   Original Report Authenticated By: Onalee Hua Call    Dg Hip Operative Left  03/22/2012  *RADIOLOGY REPORT*  Clinical Data: Osteoarthritis  left hip.  OPERATIVE LEFT HIP  Comparison: 03/15/2012  Findings: Two intraoperative spot images demonstrate changes of left hip replacement.  Normal AP alignment.  No visible hardware complicating feature.  IMPRESSION: Left hip replacement as above.   Original Report Authenticated By: Charlett Nose, M.D.    Dg Pelvis Portable  03/22/2012  *RADIOLOGY REPORT*  Clinical Data: Osteoarthritis of the left hip. Left hip replacement.  PORTABLE PELVIS  Comparison: Radiographs dated 03/15/2012  Findings: The patient has undergone left total hip prosthesis insertion.  Soft tissue drain in place.  The prosthesis appears in excellent position in the AP projection.  The other pelvic bones demonstrate no acute abnormalities.  Mild arthritic changes of the right hip.  IMPRESSION: Satisfactory appearance of the left hip in the AP projection after total hip prosthesis insertion.   Original Report Authenticated By: Francene Boyers, M.D.     EKG:No orders found for this or any previous visit.   Hospital Course: Patient was admitted to [facility] and taken to the OR and underwent the above state procedure without complications.  Patient tolerated the procedure well and was later transferred to the recovery room and then to  the orthopaedic floor for postoperative care.  They were given PO and IV analgesics for pain control following their surgery.  They were given 24 hours of postoperative antibiotics of  Anti-infectives     Start     Dose/Rate Route Frequency Ordered Stop   03/22/12 1800   ceFAZolin (ANCEF) IVPB 2 g/50 mL premix        2 g 100 mL/hr over 30 Minutes Intravenous Every 6 hours 03/22/12 1654 03/23/12 0010   03/21/12 1503   ceFAZolin (ANCEF) 3 g in dextrose 5 % 50 mL IVPB        3 g 160 mL/hr over 30 Minutes Intravenous 60 min pre-op 03/21/12 1503 03/22/12 1041         and started on DVT prophylaxis in the form of Lovenox.   PT and OT were ordered for total hip protocol.  The patient was allowed to be WBAT with therapy. Discharge planning was consulted to help with postop disposition and equipment needs.  Patient had a good night on the evening of surgery and started to get up OOB with therapy on day one walking 200 feet.  Hemovac drain was pulled without difficulty.  The knee immobilizer was removed and discontinued.  Continued to work with therapy into day two walking 300 feet.  Dressing was changed on day two and the incision was healing well.  By day three, the patient had progressed with therapy and meeting their goals.  Incision was healing well.  Patient was seen in rounds and was ready to go home.   Discharge Medications: Prior to Admission medications   Medication Sig Start Date End Date Taking? Authorizing Provider  aspirin EC 81 MG tablet Take 81 mg by mouth 2 (two) times daily.   Yes Historical Provider, MD  atorvastatin (LIPITOR) 20 MG tablet Take 20 mg by mouth daily.   Yes Historical Provider, MD  carbamazepine (TEGRETOL XR) 200 MG 12 hr tablet Take 200 mg by mouth 2 (two) times daily.   Yes Historical Provider, MD  Multiple Vitamins-Minerals (MULTIVITAMINS THER. W/MINERALS) TABS Take 1 tablet by mouth daily.   Yes Historical Provider, MD  Sodium Chloride, Hypertonic, (SODIUM CHLORIDE  OP) Apply 1 drop to eye 2 (two) times daily.   Yes Historical Provider, MD  enoxaparin (LOVENOX) 40 MG/0.4ML injection  Inject 0.4 mLs (40 mg total) into the skin daily. 03/23/12   Loanne Drilling, MD  methocarbamol (ROBAXIN) 500 MG tablet Take 1 tablet (500 mg total) by mouth every 6 (six) hours as needed. 03/23/12   Loanne Drilling, MD  oxyCODONE (OXY IR/ROXICODONE) 5 MG immediate release tablet Take 1-2 tablets (5-10 mg total) by mouth every 3 (three) hours as needed. 03/23/12   Loanne Drilling, MD  traMADol (ULTRAM) 50 MG tablet Take 1-2 tablets (50-100 mg total) by mouth every 6 (six) hours as needed. 03/23/12   Loanne Drilling, MD    Diet: Regular diet Activity:WBAT No bending hip over 90 degrees- A "L" Angle Do not cross legs Do not let foot roll inward When turning these patients a pillow should be placed between the patient's legs to prevent crossing. Patients should have the affected knee fully extended when trying to sit or stand from all surfaces to prevent excessive hip flexion. When ambulating and turning toward the affected side the affected leg should have the toes turned out prior to moving the walker and the rest of patient's body as to prevent internal rotation/ turning in of the leg. Abduction pillows are the most effective way to prevent a patient from not crossing legs or turning toes in at rest. If an abduction pillow is not ordered placing a regular pillow length wise between the patient's legs is also an effective reminder. It is imperative that these precautions be maintained so that the surgical hip does not dislocate. Follow-up: on Dec 23rd Disposition - Home Discharged Condition: good      Medication List     As of 03/25/2012  6:24 AM    TAKE these medications         aspirin EC 81 MG tablet   Take 81 mg by mouth 2 (two) times daily.      atorvastatin 20 MG tablet   Commonly known as: LIPITOR   Take 20 mg by mouth daily.      carbamazepine 200 MG 12 hr  tablet   Commonly known as: TEGRETOL XR   Take 200 mg by mouth 2 (two) times daily.      enoxaparin 40 MG/0.4ML injection   Commonly known as: LOVENOX   Inject 0.4 mLs (40 mg total) into the skin daily.      methocarbamol 500 MG tablet   Commonly known as: ROBAXIN   Take 1 tablet (500 mg total) by mouth every 6 (six) hours as needed.      multivitamins ther. w/minerals Tabs   Take 1 tablet by mouth daily.      oxyCODONE 5 MG immediate release tablet   Commonly known as: Oxy IR/ROXICODONE   Take 1-2 tablets (5-10 mg total) by mouth every 3 (three) hours as needed.      SODIUM CHLORIDE OP   Apply 1 drop to eye 2 (two) times daily.      traMADol 50 MG tablet   Commonly known as: ULTRAM   Take 1-2 tablets (50-100 mg total) by mouth every 6 (six) hours as needed.           Follow-up Information    Follow up with Loanne Drilling, MD. Schedule an appointment as soon as possible for a visit on 04/01/2012. (Call [phone] Monday to make the appointment)    Contact information:   [address], [address], [address] [place] [address] [phone]  SignedPatrica Duel 03/25/2012, 6:24 AM

## 2012-03-25 NOTE — Progress Notes (Signed)
Pt and pt wife provided with dc instructions and education. Verbalized and teach back education. Pt educated on lovenox injections at home and pt wife able to demonstrate (on patient) giving the lovenox injection for this AM. Pt set up with HHPT. Pt denies any needs at this time. VSS. IV removed with tip intact. Heart monitor cleaned and returned to front. Levonne Spiller, RN

## 2012-12-06 ENCOUNTER — Other Ambulatory Visit (HOSPITAL_COMMUNITY): Payer: Self-pay | Admitting: Internal Medicine

## 2012-12-06 DIAGNOSIS — M5432 Sciatica, left side: Secondary | ICD-10-CM

## 2012-12-12 ENCOUNTER — Ambulatory Visit (HOSPITAL_COMMUNITY)
Admission: RE | Admit: 2012-12-12 | Discharge: 2012-12-12 | Disposition: A | Discharge status: Home / Self Care | Payer: 59 | Source: Ambulatory Visit | Attending: Internal Medicine | Admitting: Internal Medicine

## 2012-12-12 DIAGNOSIS — R209 Unspecified disturbances of skin sensation: Secondary | ICD-10-CM | POA: Insufficient documentation

## 2012-12-12 DIAGNOSIS — M51379 Other intervertebral disc degeneration, lumbosacral region without mention of lumbar back pain or lower extremity pain: Secondary | ICD-10-CM | POA: Insufficient documentation

## 2012-12-12 DIAGNOSIS — M5126 Other intervertebral disc displacement, lumbar region: Secondary | ICD-10-CM | POA: Insufficient documentation

## 2012-12-12 DIAGNOSIS — M5380 Other specified dorsopathies, site unspecified: Secondary | ICD-10-CM | POA: Insufficient documentation

## 2012-12-12 DIAGNOSIS — M5137 Other intervertebral disc degeneration, lumbosacral region: Secondary | ICD-10-CM | POA: Insufficient documentation

## 2012-12-12 DIAGNOSIS — M5432 Sciatica, left side: Secondary | ICD-10-CM

## 2012-12-13 ENCOUNTER — Other Ambulatory Visit: Payer: Self-pay | Admitting: Neurosurgery

## 2012-12-13 MED ORDER — CEFAZOLIN SODIUM-DEXTROSE 2-3 GM-% IV SOLR
2.0000 g | INTRAVENOUS | Status: AC
  Administered 2012-12-14: 2 g via INTRAVENOUS
  Filled 2012-12-13: qty 50

## 2012-12-14 ENCOUNTER — Ambulatory Visit (HOSPITAL_COMMUNITY): Payer: 59

## 2012-12-14 ENCOUNTER — Encounter (HOSPITAL_COMMUNITY): Payer: Self-pay | Admitting: *Deleted

## 2012-12-14 ENCOUNTER — Encounter (HOSPITAL_COMMUNITY)
Admission: RE | Disposition: A | Discharge status: Home / Self Care | Payer: Self-pay | Source: Ambulatory Visit | Attending: Neurosurgery

## 2012-12-14 ENCOUNTER — Encounter (HOSPITAL_COMMUNITY): Payer: Self-pay | Admitting: Anesthesiology

## 2012-12-14 ENCOUNTER — Observation Stay (HOSPITAL_COMMUNITY)
Admission: RE | Admit: 2012-12-14 | Discharge: 2012-12-15 | Disposition: A | Discharge status: Home / Self Care | Payer: 59 | Source: Ambulatory Visit | Attending: Neurosurgery | Admitting: Neurosurgery

## 2012-12-14 ENCOUNTER — Ambulatory Visit (HOSPITAL_COMMUNITY): Payer: 59 | Admitting: Anesthesiology

## 2012-12-14 DIAGNOSIS — M47817 Spondylosis without myelopathy or radiculopathy, lumbosacral region: Secondary | ICD-10-CM | POA: Insufficient documentation

## 2012-12-14 DIAGNOSIS — M5126 Other intervertebral disc displacement, lumbar region: Principal | ICD-10-CM | POA: Insufficient documentation

## 2012-12-14 DIAGNOSIS — M216X9 Other acquired deformities of unspecified foot: Secondary | ICD-10-CM | POA: Insufficient documentation

## 2012-12-14 DIAGNOSIS — M5137 Other intervertebral disc degeneration, lumbosacral region: Secondary | ICD-10-CM | POA: Insufficient documentation

## 2012-12-14 DIAGNOSIS — M51379 Other intervertebral disc degeneration, lumbosacral region without mention of lumbar back pain or lower extremity pain: Secondary | ICD-10-CM | POA: Insufficient documentation

## 2012-12-14 HISTORY — PX: LUMBAR LAMINECTOMY/DECOMPRESSION MICRODISCECTOMY: SHX5026

## 2012-12-14 HISTORY — DX: Blindness, one eye, unspecified eye: H54.40

## 2012-12-14 LAB — CBC
HCT: 42.5 % (ref 39.0–52.0)
Hemoglobin: 15.4 g/dL (ref 13.0–17.0)
MCH: 31.8 pg (ref 26.0–34.0)
MCHC: 36.2 g/dL — ABNORMAL HIGH (ref 30.0–36.0)
MCV: 87.8 fL (ref 78.0–100.0)

## 2012-12-14 LAB — SURGICAL PCR SCREEN
MRSA, PCR: POSITIVE — AB
Staphylococcus aureus: POSITIVE — AB

## 2012-12-14 LAB — BASIC METABOLIC PANEL
BUN: 15 mg/dL (ref 6–23)
CO2: 25 mEq/L (ref 19–32)
GFR calc non Af Amer: >90 mL/min (ref >90)
Glucose, Bld: 86 mg/dL (ref 70–99)
Potassium: 3.9 mEq/L (ref 3.5–5.1)

## 2012-12-14 SURGERY — LUMBAR LAMINECTOMY/DECOMPRESSION MICRODISCECTOMY 1 LEVEL
Anesthesia: General | Site: Back | Laterality: Left | Wound class: Clean

## 2012-12-14 MED ORDER — EPHEDRINE SULFATE 50 MG/ML IJ SOLN
INTRAMUSCULAR | Status: DC | PRN
  Administered 2012-12-14 (×2): 5 mg via INTRAVENOUS

## 2012-12-14 MED ORDER — NEOSTIGMINE METHYLSULFATE 1 MG/ML IJ SOLN
INTRAMUSCULAR | Status: DC | PRN
  Administered 2012-12-14: 4 mg via INTRAVENOUS

## 2012-12-14 MED ORDER — HYDROCODONE-ACETAMINOPHEN 5-325 MG PO TABS: 1.0000 | ORAL_TABLET | ORAL | Status: DC | PRN

## 2012-12-14 MED ORDER — SODIUM CHLORIDE 0.9 % IJ SOLN: 3.0000 mL | INTRAMUSCULAR | Status: DC | PRN

## 2012-12-14 MED ORDER — MENTHOL 3 MG MT LOZG: 1.0000 | LOZENGE | OROMUCOSAL | Status: DC | PRN

## 2012-12-14 MED ORDER — FENTANYL CITRATE 0.05 MG/ML IJ SOLN
INTRAMUSCULAR | Status: AC
  Filled 2012-12-14: qty 2

## 2012-12-14 MED ORDER — ATORVASTATIN CALCIUM 20 MG PO TABS
20.0000 mg | ORAL_TABLET | Freq: Every day | ORAL | Status: DC
  Administered 2012-12-14: 20 mg via ORAL
  Filled 2012-12-14 (×2): qty 1

## 2012-12-14 MED ORDER — GLYCOPYRROLATE 0.2 MG/ML IJ SOLN
INTRAMUSCULAR | Status: DC | PRN
  Administered 2012-12-14: 0.6 mg via INTRAVENOUS

## 2012-12-14 MED ORDER — MUPIROCIN 2 % EX OINT
TOPICAL_OINTMENT | Freq: Two times a day (BID) | CUTANEOUS | Status: DC
  Administered 2012-12-14 – 2012-12-15 (×3): via NASAL
  Filled 2012-12-14: qty 22

## 2012-12-14 MED ORDER — MAGNESIUM HYDROXIDE 400 MG/5ML PO SUSP: 30.0000 mL | Freq: Every day | ORAL | Status: DC | PRN

## 2012-12-14 MED ORDER — HYDROXYZINE HCL 25 MG PO TABS: 50.0000 mg | ORAL_TABLET | ORAL | Status: DC | PRN

## 2012-12-14 MED ORDER — ACETAMINOPHEN 325 MG PO TABS: 650.0000 mg | ORAL_TABLET | ORAL | Status: DC | PRN

## 2012-12-14 MED ORDER — METHOCARBAMOL 500 MG PO TABS
500.0000 mg | ORAL_TABLET | Freq: Four times a day (QID) | ORAL | Status: DC | PRN
  Filled 2012-12-14: qty 1

## 2012-12-14 MED ORDER — ZOLPIDEM TARTRATE 5 MG PO TABS: 5.0000 mg | ORAL_TABLET | Freq: Every evening | ORAL | Status: DC | PRN

## 2012-12-14 MED ORDER — METHYLPREDNISOLONE ACETATE 80 MG/ML IJ SUSP
INTRAMUSCULAR | Status: DC | PRN
  Administered 2012-12-14: 80 mg

## 2012-12-14 MED ORDER — PROPOFOL 10 MG/ML IV BOLUS
INTRAVENOUS | Status: DC | PRN
  Administered 2012-12-14: 200 mg via INTRAVENOUS

## 2012-12-14 MED ORDER — ONDANSETRON HCL 4 MG/2ML IJ SOLN
INTRAMUSCULAR | Status: DC | PRN
  Administered 2012-12-14: 4 mg via INTRAVENOUS

## 2012-12-14 MED ORDER — KCL IN DEXTROSE-NACL 20-5-0.45 MEQ/L-%-% IV SOLN
INTRAVENOUS | Status: DC
  Filled 2012-12-14 (×4): qty 1000

## 2012-12-14 MED ORDER — ROCURONIUM BROMIDE 100 MG/10ML IV SOLN
INTRAVENOUS | Status: DC | PRN
  Administered 2012-12-14: 50 mg via INTRAVENOUS

## 2012-12-14 MED ORDER — FENTANYL CITRATE 0.05 MG/ML IJ SOLN
INTRAMUSCULAR | Status: DC | PRN
  Administered 2012-12-14: 100 ug via INTRAVENOUS

## 2012-12-14 MED ORDER — LIDOCAINE HCL (CARDIAC) 20 MG/ML IV SOLN
INTRAVENOUS | Status: DC | PRN
  Administered 2012-12-14: 80 mg via INTRAVENOUS

## 2012-12-14 MED ORDER — FENTANYL CITRATE 0.05 MG/ML IJ SOLN
INTRAMUSCULAR | Status: DC | PRN
  Administered 2012-12-14: 200 ug via INTRAVENOUS
  Administered 2012-12-14: 50 ug via INTRAVENOUS

## 2012-12-14 MED ORDER — CYCLOBENZAPRINE HCL 10 MG PO TABS: 10.0000 mg | ORAL_TABLET | Freq: Three times a day (TID) | ORAL | Status: DC | PRN

## 2012-12-14 MED ORDER — LIDOCAINE-EPINEPHRINE 1 %-1:100000 IJ SOLN
INTRAMUSCULAR | Status: DC | PRN
  Administered 2012-12-14: 20 mL

## 2012-12-14 MED ORDER — HYDROMORPHONE HCL PF 1 MG/ML IJ SOLN
INTRAMUSCULAR | Status: AC
  Filled 2012-12-14: qty 1

## 2012-12-14 MED ORDER — LACTATED RINGERS IV SOLN
INTRAVENOUS | Status: DC
  Administered 2012-12-14: 12:00:00 via INTRAVENOUS

## 2012-12-14 MED ORDER — KETOROLAC TROMETHAMINE 30 MG/ML IJ SOLN
30.0000 mg | Freq: Once | INTRAMUSCULAR | Status: AC
  Administered 2012-12-14: 30 mg via INTRAVENOUS

## 2012-12-14 MED ORDER — BUPIVACAINE HCL (PF) 0.5 % IJ SOLN
INTRAMUSCULAR | Status: DC | PRN
  Administered 2012-12-14: 20 mL

## 2012-12-14 MED ORDER — KETOROLAC TROMETHAMINE 30 MG/ML IJ SOLN
30.0000 mg | Freq: Four times a day (QID) | INTRAMUSCULAR | Status: DC
  Administered 2012-12-14 – 2012-12-15 (×2): 30 mg via INTRAVENOUS
  Filled 2012-12-14 (×6): qty 1

## 2012-12-14 MED ORDER — BISACODYL 10 MG RE SUPP: 10.0000 mg | Freq: Every day | RECTAL | Status: DC | PRN

## 2012-12-14 MED ORDER — DEXAMETHASONE SODIUM PHOSPHATE 10 MG/ML IJ SOLN
INTRAMUSCULAR | Status: DC | PRN
  Administered 2012-12-14: 8 mg via INTRAVENOUS

## 2012-12-14 MED ORDER — MORPHINE SULFATE 4 MG/ML IJ SOLN: 4.0000 mg | INTRAMUSCULAR | Status: DC | PRN

## 2012-12-14 MED ORDER — PHENYLEPHRINE HCL 10 MG/ML IJ SOLN
INTRAMUSCULAR | Status: DC | PRN
  Administered 2012-12-14 (×5): 40 ug via INTRAVENOUS

## 2012-12-14 MED ORDER — MIDAZOLAM HCL 5 MG/5ML IJ SOLN
INTRAMUSCULAR | Status: DC | PRN
  Administered 2012-12-14: 2 mg via INTRAVENOUS

## 2012-12-14 MED ORDER — PHENOL 1.4 % MT LIQD: 1.0000 | OROMUCOSAL | Status: DC | PRN

## 2012-12-14 MED ORDER — HEMOSTATIC AGENTS (NO CHARGE) OPTIME
TOPICAL | Status: DC | PRN
  Administered 2012-12-14: 1 via TOPICAL

## 2012-12-14 MED ORDER — CARBAMAZEPINE ER 200 MG PO TB12
200.0000 mg | ORAL_TABLET | Freq: Two times a day (BID) | ORAL | Status: DC
  Administered 2012-12-14: 200 mg via ORAL
  Filled 2012-12-14 (×4): qty 1

## 2012-12-14 MED ORDER — SODIUM CHLORIDE 0.9 % IJ SOLN
3.0000 mL | Freq: Two times a day (BID) | INTRAMUSCULAR | Status: DC
  Administered 2012-12-14: 3 mL via INTRAVENOUS

## 2012-12-14 MED ORDER — SODIUM CHLORIDE 0.9 % IR SOLN
Status: DC | PRN
  Administered 2012-12-14: 14:00:00

## 2012-12-14 MED ORDER — THROMBIN 5000 UNITS EX SOLR
CUTANEOUS | Status: DC | PRN
  Administered 2012-12-14 (×2): 5000 [IU] via TOPICAL

## 2012-12-14 MED ORDER — ACETAMINOPHEN 650 MG RE SUPP: 650.0000 mg | RECTAL | Status: DC | PRN

## 2012-12-14 MED ORDER — MUPIROCIN 2 % EX OINT
TOPICAL_OINTMENT | CUTANEOUS | Status: AC
  Filled 2012-12-14: qty 22

## 2012-12-14 MED ORDER — KETOROLAC TROMETHAMINE 30 MG/ML IJ SOLN
INTRAMUSCULAR | Status: AC
  Filled 2012-12-14: qty 1

## 2012-12-14 MED ORDER — 0.9 % SODIUM CHLORIDE (POUR BTL) OPTIME
TOPICAL | Status: DC | PRN
  Administered 2012-12-14: 1000 mL

## 2012-12-14 MED ORDER — LACTATED RINGERS IV SOLN
INTRAVENOUS | Status: DC | PRN
  Administered 2012-12-14 (×2): via INTRAVENOUS

## 2012-12-14 MED ORDER — ALUM & MAG HYDROXIDE-SIMETH 200-200-20 MG/5ML PO SUSP: 30.0000 mL | Freq: Four times a day (QID) | ORAL | Status: DC | PRN

## 2012-12-14 MED ORDER — OXYCODONE-ACETAMINOPHEN 5-325 MG PO TABS: 1.0000 | ORAL_TABLET | ORAL | Status: DC | PRN

## 2012-12-14 MED ORDER — ONDANSETRON HCL 4 MG/2ML IJ SOLN: 4.0000 mg | Freq: Four times a day (QID) | INTRAMUSCULAR | Status: DC | PRN

## 2012-12-14 SURGICAL SUPPLY — 57 items
BAG DECANTER FOR FLEXI CONT (MISCELLANEOUS) ×2 IMPLANT
BENZOIN TINCTURE PRP APPL 2/3 (GAUZE/BANDAGES/DRESSINGS) IMPLANT
BLADE SURG ROTATE 9660 (MISCELLANEOUS) IMPLANT
BRUSH SCRUB EZ PLAIN DRY (MISCELLANEOUS) ×2 IMPLANT
BUR ACORN 6.0 ACORN (BURR) IMPLANT
BUR ACRON 5.0MM COATED (BURR) ×2 IMPLANT
BUR MATCHSTICK NEURO 3.0 LAGG (BURR) ×2 IMPLANT
CANISTER SUCTION 2500CC (MISCELLANEOUS) ×2 IMPLANT
CLOTH BEACON ORANGE TIMEOUT ST (SAFETY) ×2 IMPLANT
CONT SPEC 4OZ CLIKSEAL STRL BL (MISCELLANEOUS) ×2 IMPLANT
DERMABOND ADVANCED (GAUZE/BANDAGES/DRESSINGS) ×1
DERMABOND ADVANCED .7 DNX12 (GAUZE/BANDAGES/DRESSINGS) ×1 IMPLANT
DRAPE LAPAROTOMY 100X72X124 (DRAPES) ×2 IMPLANT
DRAPE MICROSCOPE LEICA (MISCELLANEOUS) ×4 IMPLANT
DRAPE POUCH INSTRU U-SHP 10X18 (DRAPES) ×2 IMPLANT
DRSG EMULSION OIL 3X3 NADH (GAUZE/BANDAGES/DRESSINGS) IMPLANT
ELECT REM PT RETURN 9FT ADLT (ELECTROSURGICAL) ×2
ELECTRODE REM PT RTRN 9FT ADLT (ELECTROSURGICAL) ×1 IMPLANT
GAUZE SPONGE 4X4 16PLY XRAY LF (GAUZE/BANDAGES/DRESSINGS) IMPLANT
GLOVE BIO SURGEON STRL SZ 6.5 (GLOVE) ×6 IMPLANT
GLOVE BIO SURGEON STRL SZ8.5 (GLOVE) ×2 IMPLANT
GLOVE BIOGEL PI IND STRL 8 (GLOVE) ×1 IMPLANT
GLOVE BIOGEL PI INDICATOR 8 (GLOVE) ×1
GLOVE ECLIPSE 7.5 STRL STRAW (GLOVE) ×2 IMPLANT
GLOVE EXAM NITRILE LRG STRL (GLOVE) IMPLANT
GLOVE EXAM NITRILE MD LF STRL (GLOVE) IMPLANT
GLOVE EXAM NITRILE XL STR (GLOVE) IMPLANT
GLOVE EXAM NITRILE XS STR PU (GLOVE) IMPLANT
GLOVE INDICATOR 6.5 STRL GRN (GLOVE) ×4 IMPLANT
GLOVE OPTIFIT SS 8.0 STRL (GLOVE) ×2 IMPLANT
GOWN BRE IMP SLV AUR LG STRL (GOWN DISPOSABLE) ×2 IMPLANT
GOWN BRE IMP SLV AUR XL STRL (GOWN DISPOSABLE) IMPLANT
GOWN STRL REIN 2XL LVL4 (GOWN DISPOSABLE) IMPLANT
KIT BASIN OR (CUSTOM PROCEDURE TRAY) ×2 IMPLANT
KIT ROOM TURNOVER OR (KITS) ×2 IMPLANT
NEEDLE HYPO 18GX1.5 BLUNT FILL (NEEDLE) ×2 IMPLANT
NEEDLE SPNL 18GX3.5 QUINCKE PK (NEEDLE) ×2 IMPLANT
NEEDLE SPNL 22GX3.5 QUINCKE BK (NEEDLE) ×2 IMPLANT
NS IRRIG 1000ML POUR BTL (IV SOLUTION) ×2 IMPLANT
PACK LAMINECTOMY NEURO (CUSTOM PROCEDURE TRAY) ×2 IMPLANT
PAD ARMBOARD 7.5X6 YLW CONV (MISCELLANEOUS) ×6 IMPLANT
PATTIES SURGICAL .5 X1 (DISPOSABLE) ×2 IMPLANT
RUBBERBAND STERILE (MISCELLANEOUS) ×8 IMPLANT
SPONGE GAUZE 4X4 12PLY (GAUZE/BANDAGES/DRESSINGS) IMPLANT
SPONGE LAP 4X18 X RAY DECT (DISPOSABLE) IMPLANT
SPONGE SURGIFOAM ABS GEL SZ50 (HEMOSTASIS) ×2 IMPLANT
STRIP CLOSURE SKIN 1/2X4 (GAUZE/BANDAGES/DRESSINGS) IMPLANT
SUT PROLENE 6 0 BV (SUTURE) IMPLANT
SUT VIC AB 1 CT1 18XBRD ANBCTR (SUTURE) ×2 IMPLANT
SUT VIC AB 1 CT1 8-18 (SUTURE) ×2
SUT VIC AB 2-0 CP2 18 (SUTURE) ×2 IMPLANT
SUT VIC AB 3-0 SH 8-18 (SUTURE) IMPLANT
SYR 20ML ECCENTRIC (SYRINGE) ×2 IMPLANT
SYR 5ML LL (SYRINGE) ×2 IMPLANT
TOWEL OR 17X24 6PK STRL BLUE (TOWEL DISPOSABLE) ×2 IMPLANT
TOWEL OR 17X26 10 PK STRL BLUE (TOWEL DISPOSABLE) ×2 IMPLANT
WATER STERILE IRR 1000ML POUR (IV SOLUTION) ×2 IMPLANT

## 2012-12-14 NOTE — Anesthesia Procedure Notes (Signed)
Procedure Name: Intubation Date/Time: 12/14/2012 1:15 PM Performed by: Ellin Goodie Pre-anesthesia Checklist: Patient identified, Emergency Drugs available, Suction available, Patient being monitored and Timeout performed Patient Re-evaluated:Patient Re-evaluated prior to inductionOxygen Delivery Method: Circle system utilized Preoxygenation: Pre-oxygenation with 100% oxygen Intubation Type: IV induction Ventilation: Mask ventilation without difficulty and Oral airway inserted - appropriate to patient size Laryngoscope Size: Mac and 4 Tube type: Oral Tube size: 7.5 mm Number of attempts: 1 Airway Equipment and Method: Stylet Placement Confirmation: ETT inserted through vocal cords under direct vision,  positive ETCO2 and breath sounds checked- equal and bilateral Secured at: 22 cm Tube secured with: Tape Comments: Easy atraumatic induction and intubation with MAC 4 blade.  Dr. Noreene Larsson verified placement of ETT.  Carlynn Herald, CRNA

## 2012-12-14 NOTE — Transfer of Care (Signed)
Immediate Anesthesia Transfer of Care Note  Patient: Alexander Carney  Procedure(s) Performed: Procedure(s) with comments: Left L/5 Hemilaminectomy/Microdiskectomy (Left) - Left Lumbar Five Hemilaminectomy/Microdiskectomy  Patient Location: PACU  Anesthesia Type:General  Level of Consciousness: awake, alert  and oriented  Airway & Oxygen Therapy: Patient connected to face mask oxygen  Post-op Assessment: Report given to PACU RN  Post vital signs: stable  Complications: No apparent anesthesia complications

## 2012-12-14 NOTE — H&P (Signed)
Subjective: Patient is a 52 y.o. male who is admitted for treatment of acute left lumbar radiculopathy, associated with a left foot drop, secondary to a very large left L4-5 lumbar disc herniation, that migrated caudally behind the body of L5. Patient reports a history of chronic low back pain for years, but has been worse over the past month, associated with stiffness. Last week he fell, and the low back pain resolved, but developed numbness in the left lateral calf extending into the medial left foot, as well as weakness in the left lower extremity distally. He is noted to the can't dorsiflex his left foot. He and his wife have been hearing a foot slap as he walks. Patient was studied yesterday with MRI of the lumbar spine which revealed the very large left L4-5 lumbar disc herniation, with a fragment has migrated caudally behind the body of L5. Patient is admitted now for a left L5 hemilaminectomy and microdiscectomy.   Patient Active Problem List   Diagnosis Date Noted  . OA (osteoarthritis) of hip 03/22/2012   Past Medical History  Diagnosis Date  . Arthritis     left hip osteoarthritis  . Closed dislocation of knee, unspecified part 1980's    left x2  . Hyperlipidemia     takes Lipitor daily  . Seizures     last one in 01/2007;takes Tegretol daily  . Headache     occasionally  . Joint pain   . Right cataract     immature    Past Surgical History  Procedure Laterality Date  . Eye surgery      left eye cataract  . Eye surgery      left eye scleral buckle vitrectomy  . Eye surgery      evacuation of blood  . Eye surgery      corneal transplant  . Eye surgery      vitrectomy  . Total hip arthroplasty  03/22/2012    Procedure: TOTAL HIP ARTHROPLASTY ANTERIOR APPROACH;  Surgeon: Loanne Drilling, MD;  Location: [facility] OR;  Service: Orthopedics;  Laterality: Left;    Prescriptions prior to admission  Medication Sig Dispense Refill  . aspirin EC 81 MG tablet Take 81 mg by mouth 2  (two) times daily.      Marland Kitchen atorvastatin (LIPITOR) 20 MG tablet Take 20 mg by mouth daily.      . carbamazepine (TEGRETOL XR) 200 MG 12 hr tablet Take 200 mg by mouth 2 (two) times daily.      Marland Kitchen enoxaparin (LOVENOX) 40 MG/0.4ML injection Inject 0.4 mLs (40 mg total) into the skin daily.  7 Syringe  0  . methocarbamol (ROBAXIN) 500 MG tablet Take 1 tablet (500 mg total) by mouth every 6 (six) hours as needed.  60 tablet  0  . Multiple Vitamins-Minerals (MULTIVITAMINS THER. W/MINERALS) TABS Take 1 tablet by mouth daily.      Marland Kitchen oxyCODONE (OXY IR/ROXICODONE) 5 MG immediate release tablet Take 1-2 tablets (5-10 mg total) by mouth every 3 (three) hours as needed.  80 tablet  0  . Sodium Chloride, Hypertonic, (SODIUM CHLORIDE OP) Apply 1 drop to eye 2 (two) times daily.      . traMADol (ULTRAM) 50 MG tablet Take 1-2 tablets (50-100 mg total) by mouth every 6 (six) hours as needed.  60 tablet  1   Allergies  Allergen Reactions  . Other Anaphylaxis    Bee stings    History  Substance Use Topics  . Smoking status:  Never Smoker   . Smokeless tobacco: Not on file  . Alcohol Use: No    Family History  Problem Relation Age of Onset  . Hypertension Mother   . Glaucoma Mother   . Hemochromatosis Father   . Coronary artery disease Father      Review of Systems A comprehensive review of systems was negative.  Objective: Vital signs in last 24 hours:    EXAM: Patient is a well-developed well-nourished white male in no acute distress. Lungs are clear to auscultation , the patient has symmetrical respiratory excursion. Heart has a regular rate and rhythm normal S1 and S2 no murmur.   Abdomen is soft nontender nondistended bowel sounds are present. Extremity examination shows no clubbing cyanosis or edema. Musculoskeletal examination shows no tenderness to palpation over the lumbar spinous processes or paralumbar musculature. He is able to flex to 90, and is able to extend to 10. Straight leg raising  is negative bilaterally. Neurologic examination shows 5/5 strength in the iliopsoas and quadriceps bilaterally, as well as in the right dorsiflexor, EHL, and plantar flexor. However the left dorsiflexor is 3, the left EHL is 2, in the left plantar flexor is 5. Sensation is intact to pinprick in the distal lower extremities. Reflexes are 1 the quadriceps and gastrocnemius bilaterally, and toes are downgoing bilaterally. Gait and stance both favor the left lower extremity.  Data Review:CBC    Component Value Date/Time   WBC 11.8* 03/25/2012 0635   RBC 4.12* 03/25/2012 0635   HGB 12.9* 03/25/2012 0635   HCT 37.5* 03/25/2012 0635   PLT 155 03/25/2012 0635   MCV 91.0 03/25/2012 0635   MCH 31.3 03/25/2012 0635   MCHC 34.4 03/25/2012 0635   RDW 12.1 03/25/2012 0635                          BMET    Component Value Date/Time   NA 137 03/24/2012 0510   K 4.2 03/24/2012 0510   CL 101 03/24/2012 0510   CO2 27 03/24/2012 0510   GLUCOSE 119* 03/24/2012 0510   BUN 12 03/24/2012 0510   CREATININE 0.87 03/24/2012 0510   CALCIUM 8.8 03/24/2012 0510   GFRNONAA >90 03/24/2012 0510   GFRAA >90 03/24/2012 0510     Assessment/Plan: Patient with acute left lumbar radiculopathy with left foot drop secondary to a large left L4-5 lumbar disc herniation, that migrated caudally behind the body of L5. Patient admitted now for a left L5 hemilaminectomy, and microdiscectomy.  I've discussed with the patient the nature of his condition, the nature the surgical procedure, the typical length of surgery, hospital stay, and overall recuperation. We discussed limitations postoperatively. I discussed risks of surgery including risks of infection, bleeding, possibly need for transfusion, the risk of nerve root dysfunction with pain, weakness, numbness, or paresthesias, or risk of dural tear and CSF leakage and possible need for further surgery, the risk of recurrent disc herniation and the possible need for further  surgery, and the risk of anesthetic complications including myocardial infarction, stroke, pneumonia, and death. Understanding all this the patient does wish to proceed with surgery and is admitted for such.    Hewitt Shorts, MD 12/14/2012 11:13 AM

## 2012-12-14 NOTE — Anesthesia Preprocedure Evaluation (Addendum)
Anesthesia Evaluation  Patient identified by MRN, date of birth, ID band Patient awake    Reviewed: Allergy & Precautions, H&P , NPO status , Patient's Chart, lab work & pertinent test results, reviewed documented beta blocker date and time   History of Anesthesia Complications (+) AWARENESS UNDER ANESTHESIA  Airway Mallampati: II TM Distance: >3 FB     Dental  (+) Teeth Intact and Dental Advisory Given   Pulmonary          Cardiovascular negative cardio ROS  Rhythm:Regular Rate:Normal     Neuro/Psych  Headaches, Seizures -, Well Controlled,     GI/Hepatic negative GI ROS, Neg liver ROS,   Endo/Other  negative endocrine ROS  Renal/GU negative Renal ROS  negative genitourinary   Musculoskeletal negative musculoskeletal ROS (+)   Abdominal (+)  Abdomen: soft. Bowel sounds: normal.  Peds negative pediatric ROS (+)  Hematology negative hematology ROS (+)   Anesthesia Other Findings   Reproductive/Obstetrics negative OB ROS                         Anesthesia Physical Anesthesia Plan  ASA: II  Anesthesia Plan:    Post-op Pain Management:    Induction:   Airway Management Planned:   Additional Equipment:   Intra-op Plan:   Post-operative Plan:   Informed Consent:   Plan Discussed with:   Anesthesia Plan Comments:         Anesthesia Quick Evaluation

## 2012-12-14 NOTE — Plan of Care (Signed)
Problem: Consults Goal: Diagnosis - Spinal Surgery Outcome: Completed/Met Date Met:  12/14/12 Lumbar Laminectomy (Complex)

## 2012-12-14 NOTE — Op Note (Signed)
12/14/2012  2:53 PM  PATIENT:  Alexander Carney  52 y.o. male  PRE-OPERATIVE DIAGNOSIS:  Left L4-5 lumbar disc herniation, lumbar spondylosis, lumbar degenerative disc disease, left foot drop, left lumbar radiculopathy  POST-OPERATIVE DIAGNOSIS:  Left L4-5 lumbar disc herniation, lumbar spondylosis, lumbar degenerative disc disease, left foot drop, left lumbar radiculopathy  PROCEDURE:  Procedure(s): Left L/5 Hemilaminectomy/Microdiskectomy:  Left L5 hemilaminectomy, left inferior L4 hemilaminotomy, left L4-5 microdiscectomy, with microdissection, microsurgical technique, and the operating microscope  SURGEON:  Surgeon(s): Hewitt Shorts, MD Cristi Loron, MD  ASSISTANTS: Cristi Loron, M.D.  ANESTHESIA:   general  EBL:  Total I/O In: 1600 [I.V.:1600] Out: 50 [Blood:50]  BLOOD ADMINISTERED:none  COUNT: Correct per nursing staff  DICTATION: Patient was brought to the operating room and placed under general endotracheal anesthesia. Patient was turned to prone position the lumbar region was prepped with Betadine soap and solution and draped in a sterile fashion. The midline was infiltrated with local anesthetic with epinephrine. A localizing x-ray was taken and the L4-5 and L5-S1 levels were identified. Midline incision was made over the L4-S1 level and was carried down through the subcutaneous tissue to the lumbar fascia. The lumbar fascia was incised on the left side and the paraspinal muscles were dissected from the spinous processes and lamina in a subperiosteal fashion. Another x-ray was taken and the L4-5 and L5-S1 intralaminar spaces were identified. The operating microscope was draped and brought into the field provided additional magnification, illumination, and visualization. Complete left L5 hemilaminectomy and inferior left L4 hemilaminotomy were performed using the high-speed drill and Kerrison punches. The ligamentum flavum was carefully resected. The underlying thecal  sac and exiting left L5 nerve root were identified. The disc herniation was identified and the thecal sac and nerve root gently retracted medially. Initially we removed the largest fragment caudal to the L5 nerve root. This was a large fragment. We then examined above the exiting left L5 nerve root in and out of free fragment was mobilized from the surrounding epidural tissues. We then carefully examined the epidural space to ensure that no other fragments were present and that we had achieved good decompression of the exiting left L5 nerve root and thecal sac. This was confirmed. Once the discectomy was completed and good decompression of the thecal sac and nerve had been achieved hemostasis was established with the use of bipolar cautery and Gelfoam with thrombin. The Gelfoam was removed and hemostasis confirmed. We then instilled 2 cc of fentanyl and 80 mg of Depo-Medrol into the epidural space. Deep fascia was closed with interrupted undyed 1 Vicryl sutures. Scarpa's fascia was closed with interrupted undyed 1 Vicryl sutures in the subcutaneous and subcuticular layer were closed with interrupted inverted 2-0 undyed Vicryl sutures. The skin edges were approximated with Dermabond. Following surgery the patient was turned back to a supine position to be reversed from the anesthetic extubated and transferred to the recovery room for further care.   PLAN OF CARE: Admit for overnight observation  PATIENT DISPOSITION:  PACU - hemodynamically stable.   Delay start of Pharmacological VTE agent (>24hrs) due to surgical blood loss or risk of bleeding:  yes

## 2012-12-14 NOTE — Anesthesia Postprocedure Evaluation (Signed)
  Anesthesia Post-op Note  Patient: Alexander Carney  Procedure(s) Performed: Procedure(s) with comments: Left L/5 Hemilaminectomy/Microdiskectomy (Left) - Left Lumbar Five Hemilaminectomy/Microdiskectomy  Patient Location: PACU  Anesthesia Type:General  Level of Consciousness: awake, alert  and oriented  Airway and Oxygen Therapy: Patient Spontanous Breathing  Post-op Pain: mild  Post-op Assessment: Post-op Vital signs reviewed, Patient's Cardiovascular Status Stable, Respiratory Function Stable, Patent Airway, No signs of Nausea or vomiting and Pain level controlled  Post-op Vital Signs: stable  Complications: No apparent anesthesia complications

## 2012-12-15 MED ORDER — HYDROCODONE-ACETAMINOPHEN 5-325 MG PO TABS: 1.0000 | ORAL_TABLET | ORAL | Status: DC | PRN

## 2012-12-15 NOTE — Progress Notes (Signed)
Patient discharged home with script and lumbar surgery instructions. Patient and spouse stated understanding of instruction given. All questions answered with safety precautions noted.

## 2012-12-15 NOTE — Progress Notes (Signed)
Utilization Review completed.  

## 2012-12-15 NOTE — Discharge Summary (Signed)
Physician Discharge Summary  Patient ID: Alexander Carney MRN: [id number] DOB/AGE: 1960/11/11 52 y.o.  Admit date: 12/14/2012 Discharge date: 12/15/2012  Admission Diagnoses:  Left L4-5 lumbar disc herniation, lumbar spondylosis, lumbar degenerative disc disease, left foot drop, left lumbar radiculopathy  Discharge Diagnoses:  Left L4-5 lumbar disc herniation, lumbar spondylosis, lumbar degenerative disc disease, left foot drop, left lumbar radiculopathy   Discharged Condition: good  Hospital Course:  Patient was admitted for an acute left L4-5 lumbar disc herniation with a foot drop. He was taken to surgery for a left L5 hemilaminectomy, left inferior L4 hemilaminotomy, and a left L4-5 microdiscectomy. Postoperatively he has had minimal discomfort. He feels it is at some recovery in the strength, and on examination the left dorsiflexor is 3/5 and the left EHL is 2-3/5. His wound is healing nicely. He is up in the halls. He is asking to be discharged. He has been given instructions regarding wound care and activities following discharge. He is to return for followup with me in about 2 and half weeks.  Discharge Exam: Blood pressure 122/66, pulse 67, temperature 98 F (36.7 C), temperature source Oral, resp. rate 18, SpO2 100.00%.  Disposition: Home     Medication List         aspirin 81 MG chewable tablet  Chew 324 mg by mouth daily.     atorvastatin 20 MG tablet  Commonly known as:  LIPITOR  Take 20 mg by mouth daily.     carbamazepine 200 MG 12 hr tablet  Commonly known as:  TEGRETOL XR  Take 200 mg by mouth 2 (two) times daily.     HYDROcodone-acetaminophen 5-325 MG per tablet  Commonly known as:  NORCO/VICODIN  Take 1-2 tablets by mouth every 4 (four) hours as needed for pain.     SODIUM CHLORIDE OP  Apply 1 drop to eye as needed (for dry eyes).         Signed: Hewitt Shorts, MD 12/15/2012, 8:48 AM

## 2012-12-16 ENCOUNTER — Encounter (HOSPITAL_COMMUNITY): Payer: Self-pay | Admitting: Neurosurgery

## 2013-03-04 DIAGNOSIS — H269 Unspecified cataract: Secondary | ICD-10-CM | POA: Insufficient documentation

## 2013-03-05 DIAGNOSIS — H43819 Vitreous degeneration, unspecified eye: Secondary | ICD-10-CM | POA: Insufficient documentation

## 2013-03-05 DIAGNOSIS — H5210 Myopia, unspecified eye: Secondary | ICD-10-CM | POA: Insufficient documentation

## 2013-03-05 DIAGNOSIS — H3320 Serous retinal detachment, unspecified eye: Secondary | ICD-10-CM | POA: Insufficient documentation

## 2013-03-19 ENCOUNTER — Telehealth: Payer: Self-pay | Admitting: Neurology

## 2013-03-19 NOTE — Telephone Encounter (Signed)
Please advise 

## 2013-03-19 NOTE — Telephone Encounter (Signed)
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 Please call.

## 2013-03-19 NOTE — Telephone Encounter (Signed)
Alexander Carney was last seen to this office in 2009. The patient at that time was on carbamazepine. He has a history of seizures, agenesis of the corpus callosum, and a porencephalic cyst. 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 I called the patient, and I left a message. The patient wants to be reevaluated through this office, he needs to make an appointment. If the patient is off of seizure medications, he is high risk for having a recurring seizure.

## 2014-06-18 ENCOUNTER — Encounter: Payer: Self-pay | Admitting: Internal Medicine

## 2014-07-20 ENCOUNTER — Ambulatory Visit (AMBULATORY_SURGERY_CENTER): Payer: Self-pay | Admitting: *Deleted

## 2014-07-20 VITALS — Ht 70.5 in | Wt 236.0 lb

## 2014-07-20 DIAGNOSIS — Z1211 Encounter for screening for malignant neoplasm of colon: Secondary | ICD-10-CM

## 2014-07-20 MED ORDER — MOVIPREP 100 G PO SOLR: ORAL | Status: DC

## 2014-07-20 NOTE — Progress Notes (Signed)
Patient denies any allergies to eggs or soy. Patient denies any problems with anesthesia/sedation. Patient denies any oxygen use at home and does not take any diet/weight loss medications. EMMI education assisgned to patient on colonoscopy, this was explained and instructions given to patient. 

## 2014-08-03 ENCOUNTER — Encounter: Payer: 59 | Admitting: Internal Medicine

## 2014-09-14 ENCOUNTER — Telehealth: Payer: Self-pay | Admitting: Internal Medicine

## 2014-09-14 ENCOUNTER — Encounter: Payer: 59 | Admitting: Internal Medicine

## 2014-09-14 NOTE — Telephone Encounter (Signed)
No

## 2014-11-17 ENCOUNTER — Ambulatory Visit (AMBULATORY_SURGERY_CENTER): Payer: Self-pay

## 2014-11-17 VITALS — Ht 70.5 in | Wt 236.6 lb

## 2014-11-17 DIAGNOSIS — Z1211 Encounter for screening for malignant neoplasm of colon: Secondary | ICD-10-CM

## 2014-11-17 NOTE — Progress Notes (Signed)
Pt came into the office today for his pre-visit prior to his colonoscopy with Dr Hilarie Fredrickson on 11/30/14.Pt was requesting a Friday appt.He was rescheduled for his colonoscopy on 02/05/15 at 3:00pm and the PV was rescheduled for 01/22/15 at 4:30pm.He will call back if he has any further questions.

## 2014-11-30 ENCOUNTER — Encounter: Payer: 59 | Admitting: Internal Medicine

## 2015-01-25 ENCOUNTER — Ambulatory Visit (AMBULATORY_SURGERY_CENTER): Payer: Self-pay | Admitting: *Deleted

## 2015-01-25 VITALS — Ht 70.5 in | Wt 232.6 lb

## 2015-01-25 DIAGNOSIS — Z1211 Encounter for screening for malignant neoplasm of colon: Secondary | ICD-10-CM

## 2015-01-25 NOTE — Progress Notes (Signed)
Patient denies any allergies to egg or soy products. Patient denies complications with anesthesia/sedation.  Patient denies oxygen use at home and denies diet medications. Emmi instructions for colonoscopy explained and given to patient.  

## 2015-02-05 ENCOUNTER — Ambulatory Visit (AMBULATORY_SURGERY_CENTER): Payer: 59 | Admitting: Internal Medicine

## 2015-02-05 ENCOUNTER — Encounter: Payer: Self-pay | Admitting: Internal Medicine

## 2015-02-05 VITALS — BP 158/76 | HR 66 | Temp 99.2°F | Resp 18 | Ht 70.0 in | Wt 232.0 lb

## 2015-02-05 DIAGNOSIS — Z1211 Encounter for screening for malignant neoplasm of colon: Secondary | ICD-10-CM | POA: Diagnosis present

## 2015-02-05 DIAGNOSIS — D123 Benign neoplasm of transverse colon: Secondary | ICD-10-CM | POA: Diagnosis not present

## 2015-02-05 DIAGNOSIS — D125 Benign neoplasm of sigmoid colon: Secondary | ICD-10-CM

## 2015-02-05 MED ORDER — SODIUM CHLORIDE 0.9 % IV SOLN: 500.0000 mL | INTRAVENOUS | Status: DC

## 2015-02-05 NOTE — Progress Notes (Signed)
A/ox3 pleased with MAC, report to Penny RN 

## 2015-02-05 NOTE — Patient Instructions (Signed)
YOU HAD AN ENDOSCOPIC PROCEDURE TODAY AT [facility]:   Refer to the procedure report that was given to you for any specific questions about what was found during the examination.  If the procedure report does not answer your questions, please call your gastroenterologist to clarify.  If you requested that your care partner not be given the details of your procedure findings, then the procedure report has been included in a sealed envelope for you to review at your convenience later.  YOU SHOULD EXPECT: Some feelings of bloating in the abdomen. Passage of more gas than usual.  Walking can help get rid of the air that was put into your GI tract during the procedure and reduce the bloating. If you had a lower endoscopy (such as a colonoscopy or flexible sigmoidoscopy) you may notice spotting of blood in your stool or on the toilet paper. If you underwent a bowel prep for your procedure, you may not have a normal bowel movement for a few days.  Please Note:  You might notice some irritation and congestion in your nose or some drainage.  This is from the oxygen used during your procedure.  There is no need for concern and it should clear up in a day or so.  SYMPTOMS TO REPORT IMMEDIATELY:   Following lower endoscopy (colonoscopy or flexible sigmoidoscopy):  Excessive amounts of blood in the stool  Significant tenderness or worsening of abdominal pains  Swelling of the abdomen that is new, acute  Fever of 100F or higher    For urgent or emergent issues, a gastroenterologist can be reached at any hour by calling [phone].   DIET: Your first meal following the procedure should be a small meal and then it is ok to progress to your normal diet. Heavy or fried foods are harder to digest and may make you feel nauseous or bloated.  Likewise, meals heavy in dairy and vegetables can increase bloating.  Drink plenty of fluids but you should avoid alcoholic beverages for 24  hours.  ACTIVITY:  You should plan to take it easy for the rest of today and you should NOT DRIVE or use heavy machinery until tomorrow (because of the sedation medicines used during the test).    FOLLOW UP: Our staff will call the number listed on your records the next business day following your procedure to check on you and address any questions or concerns that you may have regarding the information given to you following your procedure. If we do not reach you, we will leave a message.  However, if you are feeling well and you are not experiencing any problems, there is no need to return our call.  We will assume that you have returned to your regular daily activities without incident.  If any biopsies were taken you will be contacted by phone or by letter within the next 1-3 weeks.  Please call us at [phone] if you have not heard about the biopsies in 3 weeks.    SIGNATURES/CONFIDENTIALITY: You and/or your care partner have signed paperwork which will be entered into your electronic medical record.  These signatures attest to the fact that that the information above on your After Visit Summary has been reviewed and is understood.  Full responsibility of the confidentiality of this discharge information lies with you and/or your care-partner.     information on polyps given to you today

## 2015-02-05 NOTE — Progress Notes (Signed)
Called to room to assist during endoscopic procedure.  Patient ID and intended procedure confirmed with present staff. Received instructions for my participation in the procedure from the performing physician.  

## 2015-02-05 NOTE — Op Note (Signed)
[place]  [address]. [place], [address]   COLONOSCOPY PROCEDURE REPORT  PATIENT: Alexander, Carney  MR#: [id number] BIRTHDATE: [birth year] , 67  yrs. old GENDER: male ENDOSCOPIST: Jerene Bears, MD REFERRED WN:[id number] Avva, M.D. PROCEDURE DATE:  02/05/2015 PROCEDURE:   Colonoscopy, screening and Colonoscopy with snare polypectomy First Screening Colonoscopy - Avg.  risk and is 50 yrs.  old or older Yes.  Prior Negative Screening - Now for repeat screening. N/A  History of Adenoma - Now for follow-up colonoscopy & has been > or = to 3 yrs.  N/A  Polyps removed today? Yes ASA CLASS:   Class II INDICATIONS:Screening for colonic neoplasia, Colorectal Neoplasm Risk Assessment for this procedure is average risk, and 1st colonoscopy. MEDICATIONS: Monitored anesthesia care and Propofol 300 mg IV  DESCRIPTION OF PROCEDURE:   After the risks benefits and alternatives of the procedure were thoroughly explained, informed consent was obtained.  The digital rectal exam revealed no rectal mass.   The LB [id number]  endoscope was introduced through the anus and advanced to the terminal ileum which was intubated for a short distance. No adverse events experienced.   The quality of the prep was good.  (Suprep was used)  The instrument was then slowly withdrawn as the colon was fully examined. Estimated blood loss is zero unless otherwise noted in this procedure report.  COLON FINDINGS: The examined terminal ileum appeared to be normal. Two sessile polyps measuring 5 mm in size were found at the hepatic flexure and in the sigmoid colon.  Polypectomies were performed with a cold snare.  The resection was complete, the polyp tissue was completely retrieved and sent to histology.   The examination was otherwise normal.  Retroflexed views revealed internal hemorrhoids. The time to cecum = 1.7 Withdrawal time = 9.6   The scope was withdrawn and the procedure  completed. COMPLICATIONS: There were no immediate complications.  ENDOSCOPIC IMPRESSION: 1.   The examined terminal ileum appeared to be normal 2.   Two sessile polyps were found at the hepatic flexure and in the sigmoid colon; polypectomies were performed with a cold snare 3.   The examination was otherwise normal  RECOMMENDATIONS: 1.  Await pathology results 2.  If the polyps removed today are proven to be adenomatous (pre-cancerous) polyps, you will need a repeat colonoscopy in 5 years.  Otherwise you should continue to follow colorectal cancer screening guidelines for "routine risk" patients with colonoscopy in 10 years.  You will receive a letter within 1-2 weeks with the results of your biopsy as well as final recommendations.  Please call my office if you have not received a letter after 3 weeks.  eSigned:  Jerene Bears, MD 02/05/2015 3:49 PM  cc: Prince Solian, MD and The Patient

## 2015-02-08 ENCOUNTER — Telehealth: Payer: Self-pay | Admitting: *Deleted

## 2015-02-08 NOTE — Telephone Encounter (Signed)
  Follow up Call-  Call back number 02/05/2015  Post procedure Call Back phone  # [phone]  Permission to leave phone message Yes     Patient questions:  Do you have a fever, pain , or abdominal swelling? No. Pain Score  0 *  Have you tolerated food without any problems? Yes.    Have you been able to return to your normal activities? Yes.    Do you have any questions about your discharge instructions: Diet   No. Medications  No. Follow up visit  No.  Do you have questions or concerns about your Care? No.  Actions: * If pain score is 4 or above: No action needed, pain <4.

## 2015-02-11 ENCOUNTER — Encounter: Payer: Self-pay | Admitting: Internal Medicine

## 2015-02-19 ENCOUNTER — Encounter (INDEPENDENT_AMBULATORY_CARE_PROVIDER_SITE_OTHER): Payer: 59 | Admitting: Ophthalmology

## 2015-02-19 DIAGNOSIS — H43813 Vitreous degeneration, bilateral: Secondary | ICD-10-CM

## 2015-02-19 DIAGNOSIS — H4311 Vitreous hemorrhage, right eye: Secondary | ICD-10-CM

## 2015-02-19 DIAGNOSIS — H33011 Retinal detachment with single break, right eye: Secondary | ICD-10-CM

## 2015-02-22 ENCOUNTER — Encounter (INDEPENDENT_AMBULATORY_CARE_PROVIDER_SITE_OTHER): Payer: Self-pay | Admitting: Ophthalmology

## 2015-03-16 ENCOUNTER — Encounter (INDEPENDENT_AMBULATORY_CARE_PROVIDER_SITE_OTHER): Payer: 59 | Admitting: Ophthalmology

## 2015-03-16 DIAGNOSIS — H338 Other retinal detachments: Secondary | ICD-10-CM

## 2015-04-12 ENCOUNTER — Encounter (INDEPENDENT_AMBULATORY_CARE_PROVIDER_SITE_OTHER): Payer: 59 | Admitting: Ophthalmology

## 2015-04-12 DIAGNOSIS — H33301 Unspecified retinal break, right eye: Secondary | ICD-10-CM

## 2015-04-12 DIAGNOSIS — H338 Other retinal detachments: Secondary | ICD-10-CM | POA: Diagnosis not present

## 2015-04-12 MED FILL — PREDNISOLONE AC 1% EYE DROP: 1 | 30 days supply | Qty: 5 | Fill #0

## 2015-04-26 ENCOUNTER — Ambulatory Visit (INDEPENDENT_AMBULATORY_CARE_PROVIDER_SITE_OTHER): Payer: 59 | Admitting: Ophthalmology

## 2015-04-26 DIAGNOSIS — H33301 Unspecified retinal break, right eye: Secondary | ICD-10-CM

## 2015-04-28 MED FILL — ATORVASTATIN 20 MG TABLET: 20 | 90 days supply | Qty: 90 | Fill #3

## 2015-04-28 MED FILL — carBAMazepine 200 MG TABS: 200 | 30 days supply | Qty: 60 | Fill #4

## 2015-05-28 MED FILL — carBAMazepine 200 MG TABS: 200 | 30 days supply | Qty: 60 | Fill #5

## 2015-06-21 DIAGNOSIS — M1611 Unilateral primary osteoarthritis, right hip: Secondary | ICD-10-CM | POA: Diagnosis not present

## 2015-06-29 MED FILL — carBAMazepine 200 MG TABS: 200 | 30 days supply | Qty: 60 | Fill #0

## 2015-07-06 DIAGNOSIS — R569 Unspecified convulsions: Secondary | ICD-10-CM | POA: Diagnosis not present

## 2015-07-06 DIAGNOSIS — E784 Other hyperlipidemia: Secondary | ICD-10-CM | POA: Diagnosis not present

## 2015-07-06 DIAGNOSIS — Z125 Encounter for screening for malignant neoplasm of prostate: Secondary | ICD-10-CM | POA: Diagnosis not present

## 2015-07-06 DIAGNOSIS — R8290 Unspecified abnormal findings in urine: Secondary | ICD-10-CM | POA: Diagnosis not present

## 2015-07-09 DIAGNOSIS — R3129 Other microscopic hematuria: Secondary | ICD-10-CM | POA: Diagnosis not present

## 2015-07-09 DIAGNOSIS — H3323 Serous retinal detachment, bilateral: Secondary | ICD-10-CM | POA: Diagnosis not present

## 2015-07-09 DIAGNOSIS — R569 Unspecified convulsions: Secondary | ICD-10-CM | POA: Diagnosis not present

## 2015-07-09 DIAGNOSIS — Z1389 Encounter for screening for other disorder: Secondary | ICD-10-CM | POA: Diagnosis not present

## 2015-07-09 DIAGNOSIS — I10 Essential (primary) hypertension: Secondary | ICD-10-CM | POA: Diagnosis not present

## 2015-07-09 DIAGNOSIS — Z0000 Encounter for general adult medical examination without abnormal findings: Secondary | ICD-10-CM | POA: Diagnosis not present

## 2015-07-09 DIAGNOSIS — D126 Benign neoplasm of colon, unspecified: Secondary | ICD-10-CM | POA: Diagnosis not present

## 2015-07-09 DIAGNOSIS — E784 Other hyperlipidemia: Secondary | ICD-10-CM | POA: Diagnosis not present

## 2015-07-09 DIAGNOSIS — M25551 Pain in right hip: Secondary | ICD-10-CM | POA: Diagnosis not present

## 2015-07-09 MED FILL — IRBESARTAN 150 MG TABLET: 150 | 90 days supply | Qty: 90 | Fill #0

## 2015-07-30 MED FILL — ATORVASTATIN 20 MG TABLET: 20 | 30 days supply | Qty: 30 | Fill #0

## 2015-07-30 MED FILL — carBAMazepine 200 MG TABS: 200 | 30 days supply | Qty: 60 | Fill #1

## 2015-08-30 ENCOUNTER — Ambulatory Visit (INDEPENDENT_AMBULATORY_CARE_PROVIDER_SITE_OTHER): Payer: 59 | Admitting: Ophthalmology

## 2015-08-30 DIAGNOSIS — H33301 Unspecified retinal break, right eye: Secondary | ICD-10-CM | POA: Diagnosis not present

## 2015-08-30 DIAGNOSIS — H338 Other retinal detachments: Secondary | ICD-10-CM | POA: Diagnosis not present

## 2015-08-31 DIAGNOSIS — R3129 Other microscopic hematuria: Secondary | ICD-10-CM | POA: Diagnosis not present

## 2015-08-31 DIAGNOSIS — M25551 Pain in right hip: Secondary | ICD-10-CM | POA: Diagnosis not present

## 2015-08-31 DIAGNOSIS — Z6832 Body mass index (BMI) 32.0-32.9, adult: Secondary | ICD-10-CM | POA: Diagnosis not present

## 2015-08-31 DIAGNOSIS — I10 Essential (primary) hypertension: Secondary | ICD-10-CM | POA: Diagnosis not present

## 2015-08-31 MED FILL — carBAMazepine 200 MG TABS: 200 | 30 days supply | Qty: 60 | Fill #2

## 2015-08-31 MED FILL — ATORVASTATIN 20 MG TABLET: 20 | 30 days supply | Qty: 30 | Fill #1

## 2015-09-30 MED FILL — ATORVASTATIN 20 MG TABLET: 20 | 30 days supply | Qty: 30 | Fill #2

## 2015-09-30 MED FILL — IRBESARTAN 150 MG TABLET: 150 | 90 days supply | Qty: 90 | Fill #1

## 2015-09-30 MED FILL — carBAMazepine 200 MG TABS: 200 | 30 days supply | Qty: 60 | Fill #3

## 2015-10-29 MED FILL — ATORVASTATIN 20 MG TABLET: 20 | 30 days supply | Qty: 30 | Fill #3

## 2015-10-29 MED FILL — carBAMazepine 200 MG TABS: 200 | 30 days supply | Qty: 60 | Fill #4

## 2015-11-30 MED FILL — ATORVASTATIN 20 MG TABLET: 20 | 30 days supply | Qty: 30 | Fill #4

## 2015-11-30 MED FILL — carBAMazepine 200 MG TABS: 200 | 30 days supply | Qty: 60 | Fill #5

## 2015-12-02 DIAGNOSIS — M1611 Unilateral primary osteoarthritis, right hip: Secondary | ICD-10-CM | POA: Diagnosis not present

## 2015-12-27 MED FILL — carBAMazepine 200 MG TABS: 200 | 30 days supply | Qty: 60 | Fill #0

## 2015-12-27 MED FILL — ATORVASTATIN 20 MG TABLET: 20 | 30 days supply | Qty: 30 | Fill #5

## 2016-01-03 MED FILL — IRBESARTAN 150 MG TABLET: 150 | 30 days supply | Qty: 30 | Fill #2

## 2016-01-20 ENCOUNTER — Ambulatory Visit: Payer: Self-pay | Admitting: Orthopedic Surgery

## 2016-01-26 MED FILL — IRBESARTAN-HCTZ 300-12.5 MG: 300-12.5 | 30 days supply | Qty: 30 | Fill #0

## 2016-01-26 MED FILL — ATORVASTATIN 20 MG TABLET: 20 | 30 days supply | Qty: 30 | Fill #6

## 2016-01-26 MED FILL — carBAMazepine 200 MG TABS: 200 | 30 days supply | Qty: 60 | Fill #1

## 2016-02-19 DIAGNOSIS — Z23 Encounter for immunization: Secondary | ICD-10-CM | POA: Diagnosis not present

## 2016-02-22 MED FILL — ATORVASTATIN 20 MG TABLET: 20 | 30 days supply | Qty: 30 | Fill #7

## 2016-02-22 MED FILL — IRBESARTAN-HCTZ 300-12.5 MG: 300-12.5 | 30 days supply | Qty: 30 | Fill #1

## 2016-02-22 MED FILL — carBAMazepine 200 MG TABS: 200 | 30 days supply | Qty: 60 | Fill #2

## 2016-03-01 ENCOUNTER — Ambulatory Visit (INDEPENDENT_AMBULATORY_CARE_PROVIDER_SITE_OTHER): Payer: 59 | Admitting: Ophthalmology

## 2016-03-01 DIAGNOSIS — H33301 Unspecified retinal break, right eye: Secondary | ICD-10-CM | POA: Diagnosis not present

## 2016-03-01 DIAGNOSIS — H338 Other retinal detachments: Secondary | ICD-10-CM | POA: Diagnosis not present

## 2016-03-07 ENCOUNTER — Encounter (HOSPITAL_COMMUNITY): Payer: Self-pay

## 2016-03-09 ENCOUNTER — Encounter (HOSPITAL_COMMUNITY)
Admission: RE | Admit: 2016-03-09 | Discharge: 2016-03-09 | Disposition: A | Discharge status: Home / Self Care | Payer: 59 | Source: Ambulatory Visit | Attending: Orthopedic Surgery | Admitting: Orthopedic Surgery

## 2016-03-09 ENCOUNTER — Encounter (HOSPITAL_COMMUNITY): Payer: Self-pay

## 2016-03-09 DIAGNOSIS — M1611 Unilateral primary osteoarthritis, right hip: Secondary | ICD-10-CM | POA: Insufficient documentation

## 2016-03-09 DIAGNOSIS — Z01810 Encounter for preprocedural cardiovascular examination: Secondary | ICD-10-CM | POA: Insufficient documentation

## 2016-03-09 DIAGNOSIS — Z01812 Encounter for preprocedural laboratory examination: Secondary | ICD-10-CM | POA: Diagnosis not present

## 2016-03-09 HISTORY — DX: Horseshoe tear of retina without detachment, bilateral: H33.313

## 2016-03-09 HISTORY — DX: Essential (primary) hypertension: I10

## 2016-03-09 LAB — PROTIME-INR
INR: 1.08
PROTHROMBIN TIME: 14.1 s (ref 11.4–15.2)

## 2016-03-09 LAB — COMPREHENSIVE METABOLIC PANEL
ALK PHOS: 92 U/L (ref 38–126)
ALT: 31 U/L (ref 17–63)
AST: 26 U/L (ref 15–41)
Albumin: 4.6 g/dL (ref 3.5–5.0)
Anion gap: 8 (ref 5–15)
BILIRUBIN TOTAL: 0.8 mg/dL (ref 0.3–1.2)
BUN: 13 mg/dL (ref 6–20)
CALCIUM: 8.8 mg/dL — AB (ref 8.9–10.3)
CO2: 28 mmol/L (ref 22–32)
Chloride: 102 mmol/L (ref 101–111)
Creatinine, Ser: 0.95 mg/dL (ref 0.61–1.24)
GFR calc Af Amer: >60 mL/min (ref >60)
Glucose, Bld: 93 mg/dL (ref 65–99)
POTASSIUM: 3.7 mmol/L (ref 3.5–5.1)
Sodium: 138 mmol/L (ref 135–145)
TOTAL PROTEIN: 7.4 g/dL (ref 6.5–8.1)

## 2016-03-09 LAB — SURGICAL PCR SCREEN
MRSA, PCR: NEGATIVE
STAPHYLOCOCCUS AUREUS: POSITIVE — AB

## 2016-03-09 LAB — APTT: aPTT: 30 seconds (ref 24–36)

## 2016-03-09 LAB — CBC
HEMATOCRIT: 44.3 % (ref 39.0–52.0)
HEMOGLOBIN: 15.8 g/dL (ref 13.0–17.0)
MCH: 31.3 pg (ref 26.0–34.0)
MCHC: 35.7 g/dL (ref 30.0–36.0)
MCV: 87.9 fL (ref 78.0–100.0)
Platelets: 233 10*3/uL (ref 150–400)
RBC: 5.04 MIL/uL (ref 4.22–5.81)
RDW: 12.3 % (ref 11.5–15.5)
WBC: 7.4 10*3/uL (ref 4.0–10.5)

## 2016-03-09 LAB — URINALYSIS, ROUTINE W REFLEX MICROSCOPIC
BILIRUBIN URINE: NEGATIVE
Glucose, UA: NEGATIVE mg/dL
Hgb urine dipstick: NEGATIVE
Ketones, ur: NEGATIVE mg/dL
LEUKOCYTES UA: NEGATIVE
NITRITE: NEGATIVE
PH: 7.5 (ref 5.0–8.0)
Protein, ur: NEGATIVE mg/dL
SPECIFIC GRAVITY, URINE: 1.014 (ref 1.005–1.030)

## 2016-03-09 LAB — ABO/RH: ABO/RH(D): B POS

## 2016-03-09 NOTE — Patient Instructions (Signed)
Alexander Carney  03/09/2016   Your procedure is scheduled on: Wednesday March 15, 2016  Report to [facility] Main  Entrance take [facility]  elevators to 3rd floor to  [place] at 10:30 AM.  Call this number if you have problems the morning of surgery [phone]   Remember: ONLY 1 PERSON MAY GO WITH YOU TO SHORT STAY TO GET  READY MORNING OF [place].  Do not eat food After Midnight but may take clear liquids till 7:30 am day of surgery then nothing by mouth.      Take these medicines the morning of surgery with A SIP OF WATER: Carbamazepine (Tegretol)                                You may not have any metal on your body including hair pins and              piercings  Do not wear jewelry,  lotions, powders or colognes, deodorant                        Men may shave face and neck.   Do not bring valuables to the hospital. [place].  Contacts, dentures or bridgework may not be worn into surgery.  Leave suitcase in the car. After surgery it may be brought to your room.                 Please read over the following fact sheets you were given:MRSA INFORMATION SHEET; INCENTIVE SPIROMETER; BLOOD TRANSFUSION INFORMATION SHEET  _____________________________________________________________________             [facility] - Preparing for Surgery Before surgery, you can play an important role.  Because skin is not sterile, your skin needs to be as free of germs as possible.  You can reduce the number of germs on your skin by washing with CHG (chlorahexidine gluconate) soap before surgery.  CHG is an antiseptic cleaner which kills germs and bonds with the skin to continue killing germs even after washing. Please DO NOT use if you have an allergy to CHG or antibacterial soaps.  If your skin becomes reddened/irritated stop using the CHG and inform your nurse when you arrive at Short Stay. Do not shave  (including legs and underarms) for at least 48 hours prior to the first CHG shower.  You may shave your face/neck. Please follow these instructions carefully:  1.  Shower with CHG Soap the night before surgery and the  morning of Surgery.  2.  If you choose to wash your hair, wash your hair first as usual with your  normal  shampoo.  3.  After you shampoo, rinse your hair and body thoroughly to remove the  shampoo.                           4.  Use CHG as you would any other liquid soap.  You can apply chg directly  to the skin and wash                       Gently with a scrungie or clean  washcloth.  5.  Apply the CHG Soap to your body ONLY FROM THE NECK DOWN.   Do not use on face/ open                           Wound or open sores. Avoid contact with eyes, ears mouth and genitals (private parts).                       Wash face,  Genitals (private parts) with your normal soap.             6.  Wash thoroughly, paying special attention to the area where your surgery  will be performed.  7.  Thoroughly rinse your body with warm water from the neck down.  8.  DO NOT shower/wash with your normal soap after using and rinsing off  the CHG Soap.                9.  Pat yourself dry with a clean towel.            10.  Wear clean pajamas.            11.  Place clean sheets on your bed the night of your first shower and do not  sleep with pets. Day of Surgery : Do not apply any lotions/deodorants the morning of surgery.  Please wear clean clothes to the hospital/surgery center.  FAILURE TO FOLLOW THESE INSTRUCTIONS MAY RESULT IN THE CANCELLATION OF YOUR SURGERY PATIENT SIGNATURE_________________________________  NURSE SIGNATURE__________________________________  ________________________________________________________________________   Alexander Carney  An incentive spirometer is a tool that can help keep your lungs clear and active. This tool measures how well you are filling your lungs with  each breath. Taking long deep breaths may help reverse or decrease the chance of developing breathing (pulmonary) problems (especially infection) following:  A long period of time when you are unable to move or be active. BEFORE THE PROCEDURE   If the spirometer includes an indicator to show your best effort, your nurse or respiratory therapist will set it to a desired goal.  If possible, sit up straight or lean slightly forward. Try not to slouch.  Hold the incentive spirometer in an upright position. INSTRUCTIONS FOR USE  1. Sit on the edge of your bed if possible, or sit up as far as you can in bed or on a chair. 2. Hold the incentive spirometer in an upright position. 3. Breathe out normally. 4. Place the mouthpiece in your mouth and seal your lips tightly around it. 5. Breathe in slowly and as deeply as possible, raising the piston or the ball toward the top of the column. 6. Hold your breath for 3-5 seconds or for as long as possible. Allow the piston or ball to fall to the bottom of the column. 7. Remove the mouthpiece from your mouth and breathe out normally. 8. Rest for a few seconds and repeat Steps 1 through 7 at least 10 times every 1-2 hours when you are awake. Take your time and take a few normal breaths between deep breaths. 9. The spirometer may include an indicator to show your best effort. Use the indicator as a goal to work toward during each repetition. 10. After each set of 10 deep breaths, practice coughing to be sure your lungs are clear. If you have an incision (the cut made at the time of surgery), support your incision when coughing by  placing a pillow or rolled up towels firmly against it. Once you are able to get out of bed, walk around indoors and cough well. You may stop using the incentive spirometer when instructed by your caregiver.  RISKS AND COMPLICATIONS  Take your time so you do not get dizzy or light-headed.  If you are in pain, you may need to take or  ask for pain medication before doing incentive spirometry. It is harder to take a deep breath if you are having pain. AFTER USE  Rest and breathe slowly and easily.  It can be helpful to keep track of a log of your progress. Your caregiver can provide you with a simple table to help with this. If you are using the spirometer at home, follow these instructions: [place] IF:   You are having difficultly using the spirometer.  You have trouble using the spirometer as often as instructed.  Your pain medication is not giving enough relief while using the spirometer.  You develop fever of 100.5 F (38.1 C) or higher. SEEK IMMEDIATE MEDICAL CARE IF:   You cough up bloody sputum that had not been present before.  You develop fever of 102 F (38.9 C) or greater.  You develop worsening pain at or near the incision site. MAKE SURE YOU:   Understand these instructions.  Will watch your condition.  Will get help right away if you are not doing well or get worse. Document Released: 08/07/2006 Document Revised: 06/19/2011 Document Reviewed: 10/08/2006 ExitCare Patient Information 2014 ExitCare, [place].   ________________________________________________________________________  WHAT IS A BLOOD TRANSFUSION? Blood Transfusion Information  A transfusion is the replacement of blood or some of its parts. Blood is made up of multiple cells which provide different functions.  Red blood cells carry oxygen and are used for blood loss replacement.  White blood cells fight against infection.  Platelets control bleeding.  Plasma helps clot blood.  Other blood products are available for specialized needs, such as hemophilia or other clotting disorders. BEFORE THE TRANSFUSION  Who gives blood for transfusions?   Healthy volunteers who are fully evaluated to make sure their blood is safe. This is blood bank blood. Transfusion therapy is the safest it has ever been in the practice of  medicine. Before blood is taken from a donor, a complete history is taken to make sure that person has no history of diseases nor engages in risky social behavior (examples are intravenous drug use or sexual activity with multiple partners). The donor's travel history is screened to minimize risk of transmitting infections, such as malaria. The donated blood is tested for signs of infectious diseases, such as HIV and hepatitis. The blood is then tested to be sure it is compatible with you in order to minimize the chance of a transfusion reaction. If you or a relative donates blood, this is often done in anticipation of surgery and is not appropriate for emergency situations. It takes many days to process the donated blood. RISKS AND COMPLICATIONS Although transfusion therapy is very safe and saves many lives, the main dangers of transfusion include:   Getting an infectious disease.  Developing a transfusion reaction. This is an allergic reaction to something in the blood you were given. Every precaution is taken to prevent this. The decision to have a blood transfusion has been considered carefully by your caregiver before blood is given. Blood is not given unless the benefits outweigh the risks. AFTER THE TRANSFUSION  Right after receiving a blood  transfusion, you will usually feel much better and more energetic. This is especially true if your red blood cells have gotten low (anemic). The transfusion raises the level of the red blood cells which carry oxygen, and this usually causes an energy increase.  The nurse administering the transfusion will monitor you carefully for complications. HOME CARE INSTRUCTIONS  No special instructions are needed after a transfusion. You may find your energy is better. Speak with your caregiver about any limitations on activity for underlying diseases you may have. SEEK MEDICAL CARE IF:   Your condition is not improving after your transfusion.  You develop  redness or irritation at the intravenous (IV) site. SEEK IMMEDIATE MEDICAL CARE IF:  Any of the following symptoms occur over the next 12 hours:  Shaking chills.  You have a temperature by mouth above 102 F (38.9 C), not controlled by medicine.  Chest, back, or muscle pain.  People around you feel you are not acting correctly or are confused.  Shortness of breath or difficulty breathing.  Dizziness and fainting.  You get a rash or develop hives.  You have a decrease in urine output.  Your urine turns a dark color or changes to pink, red, or brown. Any of the following symptoms occur over the next 10 days:  You have a temperature by mouth above 102 F (38.9 C), not controlled by medicine.  Shortness of breath.  Weakness after normal activity.  The white part of the eye turns yellow (jaundice).  You have a decrease in the amount of urine or are urinating less often.  Your urine turns a dark color or changes to pink, red, or brown. Document Released: 03/24/2000 Document Revised: 06/19/2011 Document Reviewed: 11/11/2007 ExitCare Patient Information 2014 ExitCare, [place].  _______________________________________________________________________   CLEAR LIQUID DIET   Foods Allowed                                                                     Foods Excluded  Coffee and tea, regular and decaf                             liquids that you cannot  Plain Jell-O in any flavor                                             see through such as: Fruit ices (not with fruit pulp)                                     milk, soups, orange juice  Iced Popsicles                                    All solid food Carbonated beverages, regular and diet  Cranberry, grape and apple juices Sports drinks like Gatorade Lightly seasoned clear broth or consume(fat free) Sugar, honey syrup  Sample Menu Breakfast                                Lunch                                      Supper Cranberry juice                    Beef broth                            Chicken broth Jell-O                                     Grape juice                           Apple juice Coffee or tea                        Jell-O                                      Popsicle                                                Coffee or tea                        Coffee or tea  _____________________________________________________________________

## 2016-03-10 MED FILL — MUPIROCIN 2% OINTMENT: 2 | 5 days supply | Qty: 22 | Fill #0

## 2016-03-10 NOTE — Progress Notes (Signed)
PCR screening in epic per PAT visit 03/09/2016 positive for STAPH. Results sent to Dr Wynelle Link and Arlee Muslim PA. Prescription for Mupriocin Ointment called to [facility] spoke with Butch Penny. Pt is aware.

## 2016-03-10 NOTE — Progress Notes (Signed)
PCR screening in epic per PAT visit 03/09/2016 sent to Dr Wynelle Link and Arlee Muslim PA

## 2016-03-14 ENCOUNTER — Ambulatory Visit: Payer: Self-pay | Admitting: Orthopedic Surgery

## 2016-03-14 NOTE — H&P (Signed)
Alexander Carney DOB: [birth year] Married / Language: English / Race: White Male Date of Admission:  03/15/2016 CC:  Right Hip Pain History of Present Illness The patient is a 55 year old male who comes in for a preoperative History and Physical. The patient is scheduled for a right total hip arthroplasty (anterior) to be performed by Dr. Dione Plover. [name], MD at [facility] on 03-15-2016. The patient is a 55 year old male who presented for follow up of their hip. The patient is being followed for their right hip pain and osteoarthritis. Symptoms reported include: pain, aching and pain with weightbearing. Reported their pain level to be mild to moderate. The following medication has been used for pain control: none. The right hip unfortunately is getting progressively worse. It is as bad or worse than the left one was when he had that done a few years back. He is thrilled with how his left hip is doing. He has absolutely no pain with it. Right hip is now bothering him at all times. It is limiting what he can and cannot do. He is ready to proceed with the other side. They have been treated conservatively in the past for the above stated problem and despite conservative measures, they continue to have progressive pain and severe functional limitations and dysfunction. They have failed non-operative management including home exercise, medications, and injections. It is felt that they would benefit from undergoing total joint replacement. Risks and benefits of the procedure have been discussed with the patient and they elect to proceed with surgery. There are no active contraindications to surgery such as ongoing infection or rapidly progressive neurological disease.  Problem List/Past Medical Hip pain (M25.559)  Osteoarthritis, Hip (715.35)  Primary osteoarthritis of right hip (M16.11)  S/P hip replacement [id number])  left Hypercholesterolemia  Seizure Disorder  Hypertension    Allergies No Known Drug Allergies   Family History  Heart Disease  father, grandmother mothers side and grandfather mothers side Cerebrovascular Accident  grandmother mothers side and grandfather mothers side  Social History Most recent primary occupation  Horticulturist, commercial....Marland KitchenMarland KitchenOwner Number of flights of stairs before winded  greater than 5 Marital status  married Illicit drug use  no Living situation  live with spouse Tobacco use  never smoker Tobacco / smoke exposure  yes outdoors only Pain Contract  no Previously in rehab  no Exercise  Exercises monthly; does other Current work status  working full time Drug/Alcohol Rehab (Currently)  no Alcohol use  current drinker; drinks beer; only occasionally per week Children  2  Medication History Aspirin (81MG  Capsule, Oral) Active. Lipitor (20MG  Tablet, Oral) Active. CarBAMazepine (200MG  Tablet, Oral) Active. Multiple Vitamin (Oral) Active. Irbesartan (150MG  Tablet, Oral) Active.  Past Surgical History  Cataract Surgery  Date: 2009. left Repair Left Eye Retina  Date: 02/2010. Left Eye Corneal Transplant  Laser Surgery Left Eye  Date: 11/2010. Total Hip Replacement - Left    Review of Systems General Not Present- Chills, Fatigue, Fever, Memory Loss, Night Sweats, Weight Gain and Weight Loss. Skin Not Present- Eczema, Hives, Itching, Lesions and Rash. HEENT Not Present- Dentures, Double Vision, Headache, Hearing Loss, Tinnitus and Visual Loss. Respiratory Not Present- Allergies, Chronic Cough, Coughing up blood, Shortness of breath at rest and Shortness of breath with exertion. Cardiovascular Not Present- Chest Pain, Difficulty Breathing Lying Down, Murmur, Palpitations, Racing/skipping heartbeats and Swelling. Gastrointestinal Not Present- Abdominal Pain, Bloody Stool, Constipation, Diarrhea, Difficulty Swallowing, Heartburn, Jaundice, Loss of appetitie, Nausea and Vomiting.  Male  Genitourinary Not Present- Blood in Urine, Discharge, Flank Pain, Incontinence, Painful Urination, Urgency, Urinary frequency, Urinary Retention, Urinating at Night and Weak urinary stream. Musculoskeletal Not Present- Back Pain, Joint Pain, Joint Swelling, Morning Stiffness, Muscle Pain, Muscle Weakness and Spasms. Neurological Not Present- Blackout spells, Difficulty with balance, Dizziness, Paralysis, Tremor and Weakness. Psychiatric Not Present- Insomnia.  Vitals Weight: 219 lb Height: 71.5in Weight was reported by patient. Height was reported by patient. Body Surface Area: 2.2 m Body Mass Index: 30.12 kg/m  Pulse: 64 (Regular)  BP: 142/84 (Sitting, Right Arm, Standard)   Physical Exam  General Mental Status -Alert, cooperative and good historian. General Appearance-pleasant, Not in acute distress. Orientation-Oriented X3. Build & Nutrition-Well nourished and Well developed.  Head and Neck Head-normocephalic, atraumatic . Neck Global Assessment - supple, no bruit auscultated on the right, no bruit auscultated on the left.  Eye Vision -Note: artificial left eye.  Pupil - Right-Normal. Motion - Right-EOMI.  Chest and Lung Exam Auscultation Breath sounds - clear at anterior chest wall and clear at posterior chest wall. Adventitious sounds - No Adventitious sounds.  Cardiovascular Auscultation Rhythm - Regular rate and rhythm. Heart Sounds - S1 WNL and S2 WNL. Murmurs & Other Heart Sounds - Auscultation of the heart reveals - No Murmurs.  Abdomen Palpation/Percussion Tenderness - Abdomen is non-tender to palpation. Rigidity (guarding) - Abdomen is soft. Auscultation Auscultation of the abdomen reveals - Bowel sounds normal.  Male Genitourinary Note: Not done, not pertinent to present illness   Musculoskeletal Note: He is alert and oriented, in no apparent distress. His left hip can be flexed to about 120, rotated in 30, out 40, abducted  40 without discomfort. Right hip flex 90, no internal rotation. He actually has about a 20 degree external rotation contracture. He can externally rotate about 10 degrees beyond that. He has got about 20 degrees of abduction. Gait pattern is significantly antalgic on the right.  Radiographs taken a few months ago AP pelvis and lateral of the hip show the prosthesis on the left in excellent position with no periprosthetic abnormalities. On the right, he has got bone on bone arthritis, especially superolateral with some impingement morphology.  Assessment & Plan Primary osteoarthritis of right hip (M16.11)  Note:Surgical Plans: Right Total Hip Replacement - Anterior Approach  Disposition: Home  PCP: Dr. Dagmar Hait  IV TXA  Anesthesia Issues: None  Signed electronically by Ok Edwards, III PA-C

## 2016-03-15 ENCOUNTER — Inpatient Hospital Stay (HOSPITAL_COMMUNITY)
Admission: RE | Admit: 2016-03-15 | Discharge: 2016-03-17 | DRG: 470 | Disposition: A | Discharge status: Home / Self Care | Payer: 59 | Source: Ambulatory Visit | Attending: Orthopedic Surgery | Admitting: Orthopedic Surgery

## 2016-03-15 ENCOUNTER — Inpatient Hospital Stay (HOSPITAL_COMMUNITY): Payer: 59 | Admitting: Certified Registered Nurse Anesthetist

## 2016-03-15 ENCOUNTER — Inpatient Hospital Stay (HOSPITAL_COMMUNITY): Payer: 59

## 2016-03-15 ENCOUNTER — Encounter (HOSPITAL_COMMUNITY)
Admission: RE | Disposition: A | Discharge status: Home / Self Care | Payer: Self-pay | Source: Ambulatory Visit | Attending: Orthopedic Surgery

## 2016-03-15 ENCOUNTER — Encounter (HOSPITAL_COMMUNITY): Payer: Self-pay | Admitting: *Deleted

## 2016-03-15 DIAGNOSIS — Z947 Corneal transplant status: Secondary | ICD-10-CM

## 2016-03-15 DIAGNOSIS — Z96642 Presence of left artificial hip joint: Secondary | ICD-10-CM | POA: Diagnosis present

## 2016-03-15 DIAGNOSIS — M169 Osteoarthritis of hip, unspecified: Secondary | ICD-10-CM | POA: Diagnosis present

## 2016-03-15 DIAGNOSIS — Z96641 Presence of right artificial hip joint: Secondary | ICD-10-CM | POA: Diagnosis not present

## 2016-03-15 DIAGNOSIS — H3320 Serous retinal detachment, unspecified eye: Secondary | ICD-10-CM | POA: Diagnosis not present

## 2016-03-15 DIAGNOSIS — Z96649 Presence of unspecified artificial hip joint: Secondary | ICD-10-CM

## 2016-03-15 DIAGNOSIS — E785 Hyperlipidemia, unspecified: Secondary | ICD-10-CM | POA: Diagnosis present

## 2016-03-15 DIAGNOSIS — Z9842 Cataract extraction status, left eye: Secondary | ICD-10-CM | POA: Diagnosis not present

## 2016-03-15 DIAGNOSIS — Z471 Aftercare following joint replacement surgery: Secondary | ICD-10-CM | POA: Diagnosis not present

## 2016-03-15 DIAGNOSIS — H43819 Vitreous degeneration, unspecified eye: Secondary | ICD-10-CM | POA: Diagnosis not present

## 2016-03-15 DIAGNOSIS — M1611 Unilateral primary osteoarthritis, right hip: Principal | ICD-10-CM | POA: Diagnosis present

## 2016-03-15 DIAGNOSIS — H5462 Unqualified visual loss, left eye, normal vision right eye: Secondary | ICD-10-CM | POA: Diagnosis present

## 2016-03-15 DIAGNOSIS — Z823 Family history of stroke: Secondary | ICD-10-CM | POA: Diagnosis not present

## 2016-03-15 DIAGNOSIS — Z8249 Family history of ischemic heart disease and other diseases of the circulatory system: Secondary | ICD-10-CM

## 2016-03-15 DIAGNOSIS — G40909 Epilepsy, unspecified, not intractable, without status epilepticus: Secondary | ICD-10-CM | POA: Diagnosis present

## 2016-03-15 DIAGNOSIS — H269 Unspecified cataract: Secondary | ICD-10-CM | POA: Diagnosis not present

## 2016-03-15 DIAGNOSIS — I10 Essential (primary) hypertension: Secondary | ICD-10-CM | POA: Diagnosis present

## 2016-03-15 HISTORY — PX: TOTAL HIP ARTHROPLASTY: SHX124

## 2016-03-15 LAB — TYPE AND SCREEN
ABO/RH(D): B POS
ANTIBODY SCREEN: NEGATIVE

## 2016-03-15 SURGERY — ARTHROPLASTY, HIP, TOTAL, ANTERIOR APPROACH
Anesthesia: Monitor Anesthesia Care | Site: Hip | Laterality: Right

## 2016-03-15 MED ORDER — CEFAZOLIN SODIUM-DEXTROSE 2-4 GM/100ML-% IV SOLN
2.0000 g | Freq: Four times a day (QID) | INTRAVENOUS | Status: AC
  Administered 2016-03-15 – 2016-03-16 (×2): 2 g via INTRAVENOUS
  Filled 2016-03-15 (×2): qty 100

## 2016-03-15 MED ORDER — DIPHENHYDRAMINE HCL 12.5 MG/5ML PO ELIX: 12.5000 mg | ORAL_SOLUTION | ORAL | Status: DC | PRN

## 2016-03-15 MED ORDER — HYDROCHLOROTHIAZIDE 12.5 MG PO CAPS
12.5000 mg | ORAL_CAPSULE | Freq: Every day | ORAL | Status: DC
  Administered 2016-03-16 – 2016-03-17 (×2): 12.5 mg via ORAL
  Filled 2016-03-15 (×2): qty 1

## 2016-03-15 MED ORDER — ACETAMINOPHEN 10 MG/ML IV SOLN
1000.0000 mg | Freq: Once | INTRAVENOUS | Status: AC
  Administered 2016-03-15: 1000 mg via INTRAVENOUS
  Filled 2016-03-15: qty 100

## 2016-03-15 MED ORDER — METHOCARBAMOL 500 MG PO TABS
500.0000 mg | ORAL_TABLET | Freq: Four times a day (QID) | ORAL | Status: DC | PRN
  Administered 2016-03-16 – 2016-03-17 (×6): 500 mg via ORAL
  Filled 2016-03-15 (×7): qty 1

## 2016-03-15 MED ORDER — MIDAZOLAM HCL 5 MG/5ML IJ SOLN
INTRAMUSCULAR | Status: DC | PRN
  Administered 2016-03-15: 2 mg via INTRAVENOUS

## 2016-03-15 MED ORDER — FLEET ENEMA 7-19 GM/118ML RE ENEM: 1.0000 | ENEMA | Freq: Once | RECTAL | Status: DC | PRN

## 2016-03-15 MED ORDER — PHENYLEPHRINE HCL 10 MG/ML IJ SOLN
INTRAMUSCULAR | Status: DC | PRN
  Administered 2016-03-15 (×5): 80 ug via INTRAVENOUS

## 2016-03-15 MED ORDER — MIDAZOLAM HCL 2 MG/2ML IJ SOLN
INTRAMUSCULAR | Status: AC
  Filled 2016-03-15: qty 2

## 2016-03-15 MED ORDER — LACTATED RINGERS IV SOLN
INTRAVENOUS | Status: DC
  Administered 2016-03-15 (×3): via INTRAVENOUS

## 2016-03-15 MED ORDER — BUPIVACAINE HCL (PF) 0.25 % IJ SOLN
INTRAMUSCULAR | Status: DC | PRN
  Administered 2016-03-15: 30 mL

## 2016-03-15 MED ORDER — PROMETHAZINE HCL 25 MG/ML IJ SOLN: 6.2500 mg | INTRAMUSCULAR | Status: DC | PRN

## 2016-03-15 MED ORDER — IRBESARTAN-HYDROCHLOROTHIAZIDE 300-12.5 MG PO TABS: 1.0000 | ORAL_TABLET | Freq: Every day | ORAL | Status: DC

## 2016-03-15 MED ORDER — MENTHOL 3 MG MT LOZG: 1.0000 | LOZENGE | OROMUCOSAL | Status: DC | PRN

## 2016-03-15 MED ORDER — BISACODYL 10 MG RE SUPP: 10.0000 mg | Freq: Every day | RECTAL | Status: DC | PRN

## 2016-03-15 MED ORDER — FENTANYL CITRATE (PF) 100 MCG/2ML IJ SOLN
INTRAMUSCULAR | Status: AC
  Filled 2016-03-15: qty 2

## 2016-03-15 MED ORDER — PHENOL 1.4 % MT LIQD: 1.0000 | OROMUCOSAL | Status: DC | PRN

## 2016-03-15 MED ORDER — PROPOFOL 10 MG/ML IV BOLUS
INTRAVENOUS | Status: DC | PRN
  Administered 2016-03-15 (×2): 20 mg via INTRAVENOUS

## 2016-03-15 MED ORDER — ATORVASTATIN CALCIUM 20 MG PO TABS
20.0000 mg | ORAL_TABLET | Freq: Every day | ORAL | Status: DC
  Administered 2016-03-16 – 2016-03-17 (×2): 20 mg via ORAL
  Filled 2016-03-15 (×2): qty 1

## 2016-03-15 MED ORDER — ONDANSETRON HCL 4 MG/2ML IJ SOLN
INTRAMUSCULAR | Status: DC | PRN
  Administered 2016-03-15: 4 mg via INTRAVENOUS

## 2016-03-15 MED ORDER — RIVAROXABAN 10 MG PO TABS: 10.0000 mg | ORAL_TABLET | Freq: Every day | ORAL | Status: DC

## 2016-03-15 MED ORDER — IRBESARTAN 150 MG PO TABS
300.0000 mg | ORAL_TABLET | Freq: Every day | ORAL | Status: DC
  Administered 2016-03-16 – 2016-03-17 (×2): 300 mg via ORAL
  Filled 2016-03-15 (×2): qty 2

## 2016-03-15 MED ORDER — METHOCARBAMOL 1000 MG/10ML IJ SOLN
500.0000 mg | Freq: Four times a day (QID) | INTRAMUSCULAR | Status: DC | PRN
  Administered 2016-03-15: 500 mg via INTRAVENOUS
  Filled 2016-03-15 (×2): qty 5

## 2016-03-15 MED ORDER — ACETAMINOPHEN 500 MG PO TABS
1000.0000 mg | ORAL_TABLET | Freq: Four times a day (QID) | ORAL | Status: AC
  Administered 2016-03-15 (×2): 1000 mg via ORAL
  Administered 2016-03-16: 500 mg via ORAL
  Administered 2016-03-16: 1000 mg via ORAL
  Filled 2016-03-15 (×4): qty 2

## 2016-03-15 MED ORDER — HYDROMORPHONE HCL 1 MG/ML IJ SOLN: 0.2500 mg | INTRAMUSCULAR | Status: DC | PRN

## 2016-03-15 MED ORDER — ACETAMINOPHEN 650 MG RE SUPP: 650.0000 mg | Freq: Four times a day (QID) | RECTAL | Status: DC | PRN

## 2016-03-15 MED ORDER — BUPIVACAINE HCL (PF) 0.5 % IJ SOLN
INTRAMUSCULAR | Status: DC | PRN
  Administered 2016-03-15: 3 mL

## 2016-03-15 MED ORDER — PROPOFOL 10 MG/ML IV BOLUS
INTRAVENOUS | Status: AC
  Filled 2016-03-15: qty 20

## 2016-03-15 MED ORDER — ONDANSETRON HCL 4 MG/2ML IJ SOLN
INTRAMUSCULAR | Status: AC
  Filled 2016-03-15: qty 2

## 2016-03-15 MED ORDER — BUPIVACAINE HCL (PF) 0.25 % IJ SOLN
INTRAMUSCULAR | Status: AC
  Filled 2016-03-15: qty 30

## 2016-03-15 MED ORDER — TRANEXAMIC ACID 1000 MG/10ML IV SOLN
1000.0000 mg | INTRAVENOUS | Status: AC
  Administered 2016-03-15: 1000 mg via INTRAVENOUS
  Filled 2016-03-15: qty 1100

## 2016-03-15 MED ORDER — FENTANYL CITRATE (PF) 100 MCG/2ML IJ SOLN
INTRAMUSCULAR | Status: DC | PRN
  Administered 2016-03-15 (×2): 50 ug via INTRAVENOUS

## 2016-03-15 MED ORDER — ONDANSETRON HCL 4 MG/2ML IJ SOLN: 4.0000 mg | Freq: Four times a day (QID) | INTRAMUSCULAR | Status: DC | PRN

## 2016-03-15 MED ORDER — BUPIVACAINE HCL (PF) 0.5 % IJ SOLN
INTRAMUSCULAR | Status: AC
  Filled 2016-03-15: qty 30

## 2016-03-15 MED ORDER — STERILE WATER FOR IRRIGATION IR SOLN
Status: DC | PRN
  Administered 2016-03-15: 2000 mL

## 2016-03-15 MED ORDER — CHLORHEXIDINE GLUCONATE 4 % EX LIQD: 60.0000 mL | Freq: Once | CUTANEOUS | Status: DC

## 2016-03-15 MED ORDER — PROPOFOL 500 MG/50ML IV EMUL
INTRAVENOUS | Status: DC | PRN
  Administered 2016-03-15: 100 ug/kg/min via INTRAVENOUS

## 2016-03-15 MED ORDER — METOCLOPRAMIDE HCL 5 MG PO TABS: 5.0000 mg | ORAL_TABLET | Freq: Three times a day (TID) | ORAL | Status: DC | PRN

## 2016-03-15 MED ORDER — ONDANSETRON HCL 4 MG PO TABS: 4.0000 mg | ORAL_TABLET | Freq: Four times a day (QID) | ORAL | Status: DC | PRN

## 2016-03-15 MED ORDER — CARBAMAZEPINE 200 MG PO TABS
200.0000 mg | ORAL_TABLET | Freq: Two times a day (BID) | ORAL | Status: DC
  Administered 2016-03-15 – 2016-03-17 (×4): 200 mg via ORAL
  Filled 2016-03-15 (×4): qty 1

## 2016-03-15 MED ORDER — SODIUM CHLORIDE 0.9 % IV SOLN
INTRAVENOUS | Status: DC
  Administered 2016-03-15 – 2016-03-16 (×2): via INTRAVENOUS

## 2016-03-15 MED ORDER — DEXAMETHASONE SODIUM PHOSPHATE 10 MG/ML IJ SOLN
10.0000 mg | Freq: Once | INTRAMUSCULAR | Status: AC
  Administered 2016-03-16: 13:00:00 10 mg via INTRAVENOUS
  Filled 2016-03-15: qty 1

## 2016-03-15 MED ORDER — CEFAZOLIN SODIUM-DEXTROSE 2-4 GM/100ML-% IV SOLN
INTRAVENOUS | Status: AC
  Filled 2016-03-15: qty 100

## 2016-03-15 MED ORDER — ASPIRIN EC 325 MG PO TBEC
325.0000 mg | DELAYED_RELEASE_TABLET | Freq: Two times a day (BID) | ORAL | Status: DC
  Administered 2016-03-16 – 2016-03-17 (×3): 325 mg via ORAL
  Filled 2016-03-15 (×3): qty 1

## 2016-03-15 MED ORDER — DEXAMETHASONE SODIUM PHOSPHATE 10 MG/ML IJ SOLN
10.0000 mg | Freq: Once | INTRAMUSCULAR | Status: AC
  Administered 2016-03-15: 10 mg via INTRAVENOUS

## 2016-03-15 MED ORDER — PROPOFOL 10 MG/ML IV BOLUS
INTRAVENOUS | Status: AC
  Filled 2016-03-15: qty 60

## 2016-03-15 MED ORDER — TRAMADOL HCL 50 MG PO TABS
50.0000 mg | ORAL_TABLET | Freq: Four times a day (QID) | ORAL | Status: DC | PRN
  Administered 2016-03-16: 50 mg via ORAL
  Administered 2016-03-16 – 2016-03-17 (×5): 100 mg via ORAL
  Filled 2016-03-15 (×2): qty 2
  Filled 2016-03-15: qty 1
  Filled 2016-03-15: qty 2
  Filled 2016-03-15 (×2): qty 1
  Filled 2016-03-15 (×2): qty 2

## 2016-03-15 MED ORDER — ACETAMINOPHEN 10 MG/ML IV SOLN
INTRAVENOUS | Status: AC
  Filled 2016-03-15: qty 100

## 2016-03-15 MED ORDER — MORPHINE SULFATE (PF) 2 MG/ML IV SOLN: 1.0000 mg | INTRAVENOUS | Status: DC | PRN

## 2016-03-15 MED ORDER — 0.9 % SODIUM CHLORIDE (POUR BTL) OPTIME
TOPICAL | Status: DC | PRN
  Administered 2016-03-15: 1000 mL

## 2016-03-15 MED ORDER — METOCLOPRAMIDE HCL 5 MG/ML IJ SOLN: 5.0000 mg | Freq: Three times a day (TID) | INTRAMUSCULAR | Status: DC | PRN

## 2016-03-15 MED ORDER — CEFAZOLIN SODIUM-DEXTROSE 2-4 GM/100ML-% IV SOLN
2.0000 g | INTRAVENOUS | Status: AC
  Administered 2016-03-15: 2 g via INTRAVENOUS
  Filled 2016-03-15: qty 100

## 2016-03-15 MED ORDER — DOCUSATE SODIUM 100 MG PO CAPS
100.0000 mg | ORAL_CAPSULE | Freq: Two times a day (BID) | ORAL | Status: DC
  Administered 2016-03-15 – 2016-03-17 (×4): 100 mg via ORAL
  Filled 2016-03-15 (×4): qty 1

## 2016-03-15 MED ORDER — OXYCODONE HCL 5 MG PO TABS: 5.0000 mg | ORAL_TABLET | ORAL | Status: DC | PRN

## 2016-03-15 MED ORDER — ACETAMINOPHEN 325 MG PO TABS: 650.0000 mg | ORAL_TABLET | Freq: Four times a day (QID) | ORAL | Status: DC | PRN

## 2016-03-15 MED ORDER — POLYETHYLENE GLYCOL 3350 17 G PO PACK: 17.0000 g | PACK | Freq: Every day | ORAL | Status: DC | PRN

## 2016-03-15 MED ORDER — TRANEXAMIC ACID 1000 MG/10ML IV SOLN
1000.0000 mg | Freq: Once | INTRAVENOUS | Status: AC
  Administered 2016-03-15: 20:00:00 1000 mg via INTRAVENOUS
  Filled 2016-03-15: qty 1100

## 2016-03-15 MED ORDER — PHENYLEPHRINE 40 MCG/ML (10ML) SYRINGE FOR IV PUSH (FOR BLOOD PRESSURE SUPPORT)
PREFILLED_SYRINGE | INTRAVENOUS | Status: AC
  Filled 2016-03-15: qty 10

## 2016-03-15 MED FILL — ATORVASTATIN 20 MG TABLET: 20 | 30 days supply | Qty: 30 | Fill #8

## 2016-03-15 MED FILL — IRBESARTAN-HCTZ 300-12.5 MG: 300-12.5 | 30 days supply | Qty: 30 | Fill #2

## 2016-03-15 MED FILL — carBAMazepine 200 MG TABS: 200 | 30 days supply | Qty: 60 | Fill #3

## 2016-03-15 SURGICAL SUPPLY — 40 items
BAG DECANTER FOR FLEXI CONT (MISCELLANEOUS) ×3 IMPLANT
BAG ZIPLOCK 12X15 (MISCELLANEOUS) ×3 IMPLANT
BLADE SAG 18X100X1.27 (BLADE) ×3 IMPLANT
CAPT HIP TOTAL 2 ×3 IMPLANT
CLOSURE WOUND 1/2 X4 (GAUZE/BANDAGES/DRESSINGS) ×2
CLOTH BEACON ORANGE TIMEOUT ST (SAFETY) ×3 IMPLANT
COVER PERINEAL POST (MISCELLANEOUS) ×3 IMPLANT
DECANTER SPIKE VIAL GLASS SM (MISCELLANEOUS) ×3 IMPLANT
DRAPE STERI IOBAN 125X83 (DRAPES) ×3 IMPLANT
DRAPE U-SHAPE 47X51 STRL (DRAPES) ×6 IMPLANT
DRSG ADAPTIC 3X8 NADH LF (GAUZE/BANDAGES/DRESSINGS) ×3 IMPLANT
DRSG MEPILEX BORDER 4X4 (GAUZE/BANDAGES/DRESSINGS) ×3 IMPLANT
DRSG MEPILEX BORDER 4X8 (GAUZE/BANDAGES/DRESSINGS) ×3 IMPLANT
DURAPREP 26ML APPLICATOR (WOUND CARE) ×3 IMPLANT
ELECT REM PT RETURN 9FT ADLT (ELECTROSURGICAL) ×3
ELECTRODE REM PT RTRN 9FT ADLT (ELECTROSURGICAL) ×1 IMPLANT
EVACUATOR 1/8 PVC DRAIN (DRAIN) ×3 IMPLANT
GLOVE BIO SURGEON STRL SZ7.5 (GLOVE) ×3 IMPLANT
GLOVE BIO SURGEON STRL SZ8 (GLOVE) ×6 IMPLANT
GLOVE BIOGEL PI IND STRL 7.0 (GLOVE) ×1 IMPLANT
GLOVE BIOGEL PI IND STRL 7.5 (GLOVE) ×1 IMPLANT
GLOVE BIOGEL PI IND STRL 8 (GLOVE) ×2 IMPLANT
GLOVE BIOGEL PI INDICATOR 7.0 (GLOVE) ×2
GLOVE BIOGEL PI INDICATOR 7.5 (GLOVE) ×2
GLOVE BIOGEL PI INDICATOR 8 (GLOVE) ×4
GLOVE SURG SS PI 7.0 STRL IVOR (GLOVE) ×6 IMPLANT
GLOVE SURG SS PI 7.5 STRL IVOR (GLOVE) ×3 IMPLANT
GOWN SPEC L3 XXLG W/TWL (GOWN DISPOSABLE) ×3 IMPLANT
GOWN STRL REUS W/TWL LRG LVL3 (GOWN DISPOSABLE) ×3 IMPLANT
GOWN STRL REUS W/TWL XL LVL3 (GOWN DISPOSABLE) ×6 IMPLANT
PACK ANTERIOR HIP CUSTOM (KITS) ×3 IMPLANT
STRIP CLOSURE SKIN 1/2X4 (GAUZE/BANDAGES/DRESSINGS) ×4 IMPLANT
SUT ETHIBOND NAB CT1 #1 30IN (SUTURE) ×3 IMPLANT
SUT MNCRL AB 4-0 PS2 18 (SUTURE) ×3 IMPLANT
SUT VIC AB 2-0 CT1 27 (SUTURE) ×4
SUT VIC AB 2-0 CT1 TAPERPNT 27 (SUTURE) ×2 IMPLANT
SUT VLOC 180 0 24IN GS25 (SUTURE) ×3 IMPLANT
SYR 50ML LL SCALE MARK (SYRINGE) IMPLANT
TRAY FOLEY W/METER SILVER 16FR (SET/KITS/TRAYS/PACK) ×3 IMPLANT
YANKAUER SUCT BULB TIP 10FT TU (MISCELLANEOUS) ×3 IMPLANT

## 2016-03-15 NOTE — H&P (View-Only) (Signed)
Pierre Bali DOB: [birth year] Married / Language: English / Race: White Male Date of Admission:  03/15/2016 CC:  Right Hip Pain History of Present Illness The patient is a 55 year old male who comes in for a preoperative History and Physical. The patient is scheduled for a right total hip arthroplasty (anterior) to be performed by Dr. Dione Plover. [name], MD at [facility] on 03-15-2016. The patient is a 55 year old male who presented for follow up of their hip. The patient is being followed for their right hip pain and osteoarthritis. Symptoms reported include: pain, aching and pain with weightbearing. Reported their pain level to be mild to moderate. The following medication has been used for pain control: none. The right hip unfortunately is getting progressively worse. It is as bad or worse than the left one was when he had that done a few years back. He is thrilled with how his left hip is doing. He has absolutely no pain with it. Right hip is now bothering him at all times. It is limiting what he can and cannot do. He is ready to proceed with the other side. They have been treated conservatively in the past for the above stated problem and despite conservative measures, they continue to have progressive pain and severe functional limitations and dysfunction. They have failed non-operative management including home exercise, medications, and injections. It is felt that they would benefit from undergoing total joint replacement. Risks and benefits of the procedure have been discussed with the patient and they elect to proceed with surgery. There are no active contraindications to surgery such as ongoing infection or rapidly progressive neurological disease.  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 Hypercholesterolemia  Seizure Disorder  Hypertension    Allergies No Known Drug Allergies   Family History  Heart Disease  father, grandmother mothers side and grandfather mothers side Cerebrovascular Accident  grandmother mothers side and grandfather mothers side  Social History Most recent primary occupation  Horticulturist, commercial....Marland KitchenMarland KitchenOwner Number of flights of stairs before winded  greater than 5 Marital status  married Illicit drug use  no Living situation  live with spouse Tobacco use  never smoker Tobacco / smoke exposure  yes outdoors only Pain Contract  no Previously in rehab  no Exercise  Exercises monthly; does other Current work status  working full time Drug/Alcohol Rehab (Currently)  no Alcohol use  current drinker; drinks beer; only occasionally per week Children  2  Medication History Aspirin (81MG  Capsule, Oral) Active. Lipitor (20MG  Tablet, Oral) Active. CarBAMazepine (200MG  Tablet, Oral) Active. Multiple Vitamin (Oral) Active. Irbesartan (150MG  Tablet, Oral) Active.  Past Surgical History  Cataract Surgery  Date: 2009. left Repair Left Eye Retina  Date: 02/2010. Left Eye Corneal Transplant  Laser Surgery Left Eye  Date: 11/2010. Total Hip Replacement - Left    Review of Systems General Not Present- Chills, Fatigue, Fever, Memory Loss, Night Sweats, Weight Gain and Weight Loss. Skin Not Present- Eczema, Hives, Itching, Lesions and Rash. HEENT Not Present- Dentures, Double Vision, Headache, Hearing Loss, Tinnitus and Visual Loss. Respiratory Not Present- Allergies, Chronic Cough, Coughing up blood, Shortness of breath at rest and Shortness of breath with exertion. Cardiovascular Not Present- Chest Pain, Difficulty Breathing Lying Down, Murmur, Palpitations, Racing/skipping heartbeats and Swelling. Gastrointestinal Not Present- Abdominal Pain, Bloody Stool, Constipation, Diarrhea, Difficulty Swallowing, Heartburn, Jaundice, Loss of appetitie, Nausea and Vomiting.  Male  Genitourinary Not Present- Blood in Urine, Discharge, Flank Pain, Incontinence, Painful Urination, Urgency, Urinary frequency, Urinary Retention, Urinating at Night and Weak urinary stream. Musculoskeletal Not Present- Back Pain, Joint Pain, Joint Swelling, Morning Stiffness, Muscle Pain, Muscle Weakness and Spasms. Neurological Not Present- Blackout spells, Difficulty with balance, Dizziness, Paralysis, Tremor and Weakness. Psychiatric Not Present- Insomnia.  Vitals Weight: 219 lb Height: 71.5in Weight was reported by patient. Height was reported by patient. Body Surface Area: 2.2 m Body Mass Index: 30.12 kg/m  Pulse: 64 (Regular)  BP: 142/84 (Sitting, Right Arm, Standard)   Physical Exam  General Mental Status -Alert, cooperative and good historian. General Appearance-pleasant, Not in acute distress. Orientation-Oriented X3. Build & Nutrition-Well nourished and Well developed.  Head and Neck Head-normocephalic, atraumatic . Neck Global Assessment - supple, no bruit auscultated on the right, no bruit auscultated on the left.  Eye Vision -Note: artificial left eye.  Pupil - Right-Normal. Motion - Right-EOMI.  Chest and Lung Exam Auscultation Breath sounds - clear at anterior chest wall and clear at posterior chest wall. Adventitious sounds - No Adventitious sounds.  Cardiovascular Auscultation Rhythm - Regular rate and rhythm. Heart Sounds - S1 WNL and S2 WNL. Murmurs & Other Heart Sounds - Auscultation of the heart reveals - No Murmurs.  Abdomen Palpation/Percussion Tenderness - Abdomen is non-tender to palpation. Rigidity (guarding) - Abdomen is soft. Auscultation Auscultation of the abdomen reveals - Bowel sounds normal.  Male Genitourinary Note: Not done, not pertinent to present illness   Musculoskeletal Note: He is alert and oriented, in no apparent distress. His left hip can be flexed to about 120, rotated in 30, out 40, abducted  40 without discomfort. Right hip flex 90, no internal rotation. He actually has about a 20 degree external rotation contracture. He can externally rotate about 10 degrees beyond that. He has got about 20 degrees of abduction. Gait pattern is significantly antalgic on the right.  Radiographs taken a few months ago AP pelvis and lateral of the hip show the prosthesis on the left in excellent position with no periprosthetic abnormalities. On the right, he has got bone on bone arthritis, especially superolateral with some impingement morphology.  Assessment & Plan Primary osteoarthritis of right hip (M16.11)  Note:Surgical Plans: Right Total Hip Replacement - Anterior Approach  Disposition: Home  PCP: Dr. Dagmar Hait  IV TXA  Anesthesia Issues: None  Signed electronically by Ok Edwards, III PA-C

## 2016-03-15 NOTE — Interval H&P Note (Signed)
History and Physical Interval Note:  03/15/2016 2:06 PM  Alexander Carney  has presented today for surgery, with the diagnosis of right hip osteoarthritis  The various methods of treatment have been discussed with the patient and family. After consideration of risks, benefits and other options for treatment, the patient has consented to  Procedure(s): RIGHT TOTAL HIP ARTHROPLASTY ANTERIOR APPROACH (Right) as a surgical intervention .  The patient's history has been reviewed, patient examined, no change in status, stable for surgery.  I have reviewed the patient's chart and labs.  Questions were answered to the patient's satisfaction.     Gearlean Alf

## 2016-03-15 NOTE — Anesthesia Postprocedure Evaluation (Signed)
Anesthesia Post Note  Patient: Alexander Carney  Procedure(s) Performed: Procedure(s) (LRB): RIGHT TOTAL HIP ARTHROPLASTY ANTERIOR APPROACH (Right)  Patient location during evaluation: PACU Anesthesia Type: Spinal and MAC Level of consciousness: awake and alert Pain management: pain level controlled Vital Signs Assessment: post-procedure vital signs reviewed and stable Respiratory status: spontaneous breathing and respiratory function stable Cardiovascular status: blood pressure returned to baseline and stable Postop Assessment: spinal receding Anesthetic complications: no    Last Vitals:  Vitals:   03/15/16 1645 03/15/16 1715  BP: (!) 88/57 111/63  Pulse: (!) 57 (!) 57  Resp: 16 12  Temp:      Last Pain:  Vitals:   03/15/16 1715  TempSrc:   PainSc: Asleep                 Justn Quale DANIEL

## 2016-03-15 NOTE — Transfer of Care (Signed)
Immediate Anesthesia Transfer of Care Note  Patient: Alexander Carney  Procedure(s) Performed: Procedure(s): RIGHT TOTAL HIP ARTHROPLASTY ANTERIOR APPROACH (Right)  Patient Location: PACU  Anesthesia Type:Spinal  Level of Consciousness: sedated  Airway & Oxygen Therapy: Patient Spontanous Breathing and Patient connected to face mask oxygen  Post-op Assessment: Report given to RN and Post -op Vital signs reviewed and stable  Post vital signs: Reviewed and stable  Last Vitals:  Vitals:   03/15/16 1105  BP: (!) 165/89  Pulse: 88  Resp: 18  Temp: 37 C    Last Pain:  Vitals:   03/15/16 1105  TempSrc: Oral         Complications: No apparent anesthesia complications

## 2016-03-15 NOTE — Anesthesia Preprocedure Evaluation (Addendum)
Anesthesia Evaluation  Patient identified by MRN, date of birth, ID band Patient awake    Reviewed: Allergy & Precautions, H&P , NPO status , Patient's Chart, lab work & pertinent test results, reviewed documented beta blocker date and time   History of Anesthesia Complications (+) AWARENESS UNDER ANESTHESIA  Airway Mallampati: II  TM Distance: >3 FB     Dental  (+) Teeth Intact, Dental Advisory Given   Pulmonary    Pulmonary exam normal        Cardiovascular hypertension, negative cardio ROS   Rhythm:Regular Rate:Normal     Neuro/Psych  Headaches, Seizures -, Well Controlled,  negative psych ROS   GI/Hepatic negative GI ROS, Neg liver ROS,   Endo/Other  negative endocrine ROS  Renal/GU negative Renal ROS  negative genitourinary   Musculoskeletal negative musculoskeletal ROS (+)   Abdominal   Peds negative pediatric ROS (+)  Hematology negative hematology ROS (+)   Anesthesia Other Findings   Reproductive/Obstetrics negative OB ROS                           Anesthesia Physical  Anesthesia Plan  ASA: II  Anesthesia Plan: MAC and Spinal   Post-op Pain Management:    Induction:   Airway Management Planned: Natural Airway and Simple Face Mask  Additional Equipment:   Intra-op Plan:   Post-operative Plan:   Informed Consent: I have reviewed the patients History and Physical, chart, labs and discussed the procedure including the risks, benefits and alternatives for the proposed anesthesia with the patient or authorized representative who has indicated his/her understanding and acceptance.   Dental advisory given  Plan Discussed with: CRNA and Anesthesiologist  Anesthesia Plan Comments:        Anesthesia Quick Evaluation

## 2016-03-15 NOTE — Anesthesia Procedure Notes (Signed)
Spinal  Patient location during procedure: OR Start time: 03/15/2016 2:18 PM End time: 03/15/2016 2:24 PM Staffing Anesthesiologist: Duane Boston Resident/CRNA: Darlys Gales R Performed: resident/CRNA  Preanesthetic Checklist Completed: patient identified, site marked, surgical consent, pre-op evaluation, timeout performed, IV checked, risks and benefits discussed and monitors and equipment checked Spinal Block Patient position: sitting Prep: DuraPrep Patient monitoring: heart rate, continuous pulse ox and blood pressure Approach: midline Location: L3-4 Injection technique: single-shot Needle Needle type: Pencan  Needle gauge: 24 G Needle length: 10 cm Needle insertion depth: 8 cm

## 2016-03-15 NOTE — Op Note (Signed)
OPERATIVE REPORT- TOTAL HIP ARTHROPLASTY   PREOPERATIVE DIAGNOSIS: Osteoarthritis of the Right hip.   POSTOPERATIVE DIAGNOSIS: Osteoarthritis of the Right  hip.   PROCEDURE: Right total hip arthroplasty, anterior approach.   SURGEON: Gaynelle Arabian, MD   ASSISTANT: Arlee Muslim, PA-C  ANESTHESIA:  Spinal  ESTIMATED BLOOD LOSS:-300 ml   DRAINS: Hemovac x1.   COMPLICATIONS: None   CONDITION: PACU - hemodynamically stable.   BRIEF CLINICAL NOTE: Alexander Carney is a 55 y.o. male who has advanced end-  stage arthritis of their Right  hip with progressively worsening pain and  dysfunction.The patient has failed nonoperative management and presents for  total hip arthroplasty.   PROCEDURE IN DETAIL: After successful administration of spinal  anesthetic, the traction boots for the [facility] bed were placed on both  feet and the patient was placed onto the [facility] bed, boots placed into the leg  holders. The Right hip was then isolated from the perineum with plastic  drapes and prepped and draped in the usual sterile fashion. ASIS and  greater trochanter were marked and a oblique incision was made, starting  at about 1 cm lateral and 2 cm distal to the ASIS and coursing towards  the anterior cortex of the femur. The skin was cut with a 10 blade  through subcutaneous tissue to the level of the fascia overlying the  tensor fascia lata muscle. The fascia was then incised in line with the  incision at the junction of the anterior third and posterior 2/3rd. The  muscle was teased off the fascia and then the interval between the TFL  and the rectus was developed. The Hohmann retractor was then placed at  the top of the femoral neck over the capsule. The vessels overlying the  capsule were cauterized and the fat on top of the capsule was removed.  A Hohmann retractor was then placed anterior underneath the rectus  femoris to give exposure to the entire anterior capsule. A T-shaped   capsulotomy was performed. The edges were tagged and the femoral head  was identified.       Osteophytes are removed off the superior acetabulum.  The femoral neck was then cut in situ with an oscillating saw. Traction  was then applied to the left lower extremity utilizing the [facility]  traction. The femoral head was then removed. Retractors were placed  around the acetabulum and then circumferential removal of the labrum was  performed. Osteophytes were also removed. Reaming starts at 47 mm to  medialize and  Increased in 2 mm increments to 53 mm. We reamed in  approximately 40 degrees of abduction, 20 degrees anteversion. A 54 mm  pinnacle acetabular shell was then impacted in anatomic position under  fluoroscopic guidance with excellent purchase. We did not need to place  any additional dome screws. A 36 mm neutral + 4 marathon liner was then  placed into the acetabular shell.       The femoral lift was then placed along the lateral aspect of the femur  just distal to the vastus ridge. The leg was  externally rotated and capsule  was stripped off the inferior aspect of the femoral neck down to the  level of the lesser trochanter, this was done with electrocautery. The femur was lifted after this was performed. The  leg was then placed in an extended and adducted position essentially delivering the femur. We also removed the capsule superiorly and the piriformis from the piriformis fossa  to gain excellent exposure of the  proximal femur. Rongeur was used to remove some cancellous bone to get  into the lateral portion of the proximal femur for placement of the  initial starter reamer. The starter broaches was placed  the starter broach  and was shown to go down the center of the canal. Broaching  with the  Corail system was then performed starting at size 8, coursing  Up to size 16. A size 16 had excellent torsional and rotational  and axial stability. The trial standard offset neck was then  placed  with a 36 + 5 trial head. The hip was then reduced. We confirmed that  the stem was in the canal both on AP and lateral x-rays. It also has excellent sizing. The hip was reduced with outstanding stability through full extension and full external rotation.. AP pelvis was taken and the leg lengths were measured and found to be equal. Hip was then dislocated again and the femoral head and neck removed. The  femoral broach was removed. Size 16 Corail stem with a standard offset  neck was then impacted into the femur following native anteversion. Has  excellent purchase in the canal. Excellent torsional and rotational and  axial stability. It is confirmed to be in the canal on AP and lateral  fluoroscopic views. The 36 + 5 ceramic head was placed and the hip  reduced with outstanding stability. Again AP pelvis was taken and it  confirmed that the leg lengths were equal. The wound was then copiously  irrigated with saline solution and the capsule reattached and repaired  with Ethibond suture. 30 ml of .25% Bupivicaine was  injected into the capsule and into the edge of the tensor fascia lata as well as subcutaneous tissue. The fascia overlying the tensor fascia lata was then closed with a running #1 V-Loc. Subcu was closed with interrupted 2-0 Vicryl and subcuticular running 4-0 Monocryl. Incision was cleaned  and dried. Steri-Strips and a bulky sterile dressing applied. Hemovac  drain was hooked to suction and then the patient was awakened and transported to  recovery in stable condition.        Please note that a surgical assistant was a medical necessity for this procedure to perform it in a safe and expeditious manner. Assistant was necessary to provide appropriate retraction of vital neurovascular structures and to prevent femoral fracture and allow for anatomic placement of the prosthesis.  Gaynelle Arabian, M.D.

## 2016-03-16 ENCOUNTER — Encounter (HOSPITAL_COMMUNITY): Payer: Self-pay | Admitting: Orthopedic Surgery

## 2016-03-16 LAB — BASIC METABOLIC PANEL
ANION GAP: 7 (ref 5–15)
BUN: 14 mg/dL (ref 6–20)
CHLORIDE: 105 mmol/L (ref 101–111)
CO2: 26 mmol/L (ref 22–32)
Calcium: 8.1 mg/dL — ABNORMAL LOW (ref 8.9–10.3)
Creatinine, Ser: 0.93 mg/dL (ref 0.61–1.24)
GFR calc Af Amer: >60 mL/min (ref >60)
GFR calc non Af Amer: >60 mL/min (ref >60)
GLUCOSE: 142 mg/dL — AB (ref 65–99)
POTASSIUM: 3.6 mmol/L (ref 3.5–5.1)
Sodium: 138 mmol/L (ref 135–145)

## 2016-03-16 LAB — CBC
HCT: 39.3 % (ref 39.0–52.0)
Hemoglobin: 13.6 g/dL (ref 13.0–17.0)
MCH: 31.6 pg (ref 26.0–34.0)
MCHC: 34.6 g/dL (ref 30.0–36.0)
MCV: 91.2 fL (ref 78.0–100.0)
Platelets: 223 10*3/uL (ref 150–400)
RBC: 4.31 MIL/uL (ref 4.22–5.81)
RDW: 12.5 % (ref 11.5–15.5)
WBC: 16.4 10*3/uL — AB (ref 4.0–10.5)

## 2016-03-16 MED ORDER — TRAMADOL HCL 50 MG PO TABS: 50.0000 mg | ORAL_TABLET | Freq: Four times a day (QID) | ORAL | 1 refills | Status: DC | PRN

## 2016-03-16 MED ORDER — METHOCARBAMOL 500 MG PO TABS: 500.0000 mg | ORAL_TABLET | Freq: Four times a day (QID) | ORAL | 0 refills | Status: DC | PRN

## 2016-03-16 MED FILL — traMADol HCL 50 MG TABS: 50 | 10 days supply | Qty: 80 | Fill #0

## 2016-03-16 MED FILL — METHOCARBAMOL 500 MG TABLET: 500 | 20 days supply | Qty: 80 | Fill #0

## 2016-03-16 NOTE — Evaluation (Signed)
Occupational Therapy Evaluation Patient Details Name: Alexander Carney MRN: [id number] DOB: Nov 24, 1960 Today's Date: 03/16/2016    History of Present Illness Alexander Carney   Clinical Impression   OT education regarding ADL activity s/p THA  complete    Follow Up Recommendations  No OT follow up    Equipment Recommendations  None recommended by OT       Precautions / Restrictions Precautions Precautions: Fall      Mobility Bed Mobility Overal bed mobility: Needs Assistance Bed Mobility: Supine to Sit     Supine to sit: Supervision     General bed mobility comments: pt in chair  Transfers Overall transfer level: Needs assistance Equipment used: Rolling walker (2 wheeled) Transfers: Sit to/from [facility] Sit to Stand: Supervision         General transfer comment: cues for technique         ADL Overall ADL's : Needs assistance/impaired     Grooming: Standing;Supervision/safety           Upper Body Dressing : Set up;Sitting   Lower Body Dressing: Minimal assistance;Sit to/from stand;Cueing for safety;Cueing for sequencing;Cueing for compensatory techniques   Toilet Transfer: Supervision/safety;Ambulation;RW;Comfort height toilet   Toileting- Clothing Manipulation and Hygiene: Supervision/safety;Sit to/from stand;Cueing for sequencing;Cueing for safety;Cueing for compensatory techniques     Tub/Shower Transfer Details (indicate cue type and reason): verbalized safety                   Pertinent Vitals/Pain Pain Assessment: 0-10 Pain Score: 3  Pain Location: incision area Pain Descriptors / Indicators: Burning Pain Intervention(s): Monitored during session     Hand Dominance     Extremity/Trunk Assessment Upper Extremity Assessment Upper Extremity Assessment: Overall WFL for tasks assessed   Lower Extremity Assessment Lower Extremity Assessment: RLE deficits/detail RLE Deficits / Details: advances the leg   Cervical  / Trunk Assessment Cervical / Trunk Assessment: Normal   Communication Communication Communication: No difficulties   Cognition Arousal/Alertness: Awake/alert Behavior During Therapy: WFL for tasks assessed/performed Overall Cognitive Status: Within Functional Limits for tasks assessed                                Home Living Family/patient expects to be discharged to:: Private residence Living Arrangements: Spouse/significant other Available Help at Discharge: Family Type of Home: House Home Access: Stairs to enter Technical brewer of Steps: 4 Entrance Stairs-Rails: None Home Layout: Two level;Able to live on main level with bedroom/bathroom Alternate Level Stairs-Number of Steps: 16 Alternate Level Stairs-Rails: Right Bathroom Shower/Tub: Tub/shower unit;Walk-in shower   Bathroom Toilet: Standard     Home Equipment: Environmental consultant - 2 wheels;Bedside commode          Prior Functioning/Environment Level of Independence: Independent                       OT Goals(Current goals can be found in the care plan section) Acute Rehab OT Goals Patient Stated Goal: to walk  OT Frequency:                End of Session Equipment Utilized During Treatment: Rolling walker Nurse Communication: Mobility status  Activity Tolerance: Patient tolerated treatment well Patient left: in chair;with call bell/phone within reach;with family/visitor present   Time: 1000-1018 OT Time Calculation (min): 18 min Charges:  OT General Charges $OT Visit: 1 Procedure OT Evaluation $OT Eval Low Complexity: 1 Procedure  G-Codes:    Alexander Carney March 21, 2016, 10:33 AM

## 2016-03-16 NOTE — Care Management Note (Signed)
Case Management Note  Patient Details  Name: Alexander Carney MRN: [id number] Date of Birth: July 05, 1960  Subjective/Objective:                  RIGHT TOTAL HIP ARTHROPLASTY ANTERIOR APPROACH (Right) Action/Plan: Discharge planning Expected Discharge Date:  03/16/16               Expected Discharge Plan:  [place]  In-House Referral:     Discharge planning Services  CM Consult  Post Acute Care Choice:  Home Health Choice offered to:  Patient  DME Arranged:  N/A DME Agency:  NA  HH Arranged:  PT [place] Agency:  [facility]  Status of Service:  Completed, signed off  If discussed at [employer], dates discussed:    Additional Comments: Cm met with pt in room to offer choice of home health agency.  Pt chooses [facility] to render HHPT.  Referral given to [facility].  Pt has all DME needed at home.  No other CM needs were communicated. Dellie Catholic, RN 03/16/2016, 11:10 AM

## 2016-03-16 NOTE — Evaluation (Signed)
Physical Therapy Evaluation Patient Details Name: Alexander Carney MRN: [id number] DOB: March 26, 1961 Today's Date: 03/16/2016   History of Present Illness  [name]  Clinical Impression  The patient c/o feeling lightheaded and Hot while ambulating .BOP 110/60. Felt better after sitting. Pt admitted with above diagnosis. Pt currently with functional limitations due to the deficits listed below (see PT Problem List).  Pt will benefit from skilled PT to increase their independence and safety with mobility to allow discharge to the venue listed below.       Follow Up Recommendations Home health PT;Supervision/Assistance - 24 hour    Equipment Recommendations  None recommended by PT    Recommendations for Other Services       Precautions / Restrictions Precautions Precautions: Fall      Mobility  Bed Mobility Overal bed mobility: Needs Assistance Bed Mobility: Supine to Sit     Supine to sit: Supervision     General bed mobility comments: cues for technique  Transfers Overall transfer level: Needs assistance Equipment used: Rolling walker (2 wheeled) Transfers: Sit to/from Stand Sit to Stand: Supervision         General transfer comment: cues for technique  Ambulation/Gait Ambulation/Gait assistance: Min assist Ambulation Distance (Feet): 200 Feet Assistive device: Rolling walker (2 wheeled) Gait Pattern/deviations: Step-to pattern;Step-through pattern;Antalgic     General Gait Details: after 300' patient c/o feeling dizzy and hot. chair brought up. BP 110/60. felt better after sitting   Stairs            Wheelchair Mobility    Modified Rankin (Stroke Patients Only)       Balance                                             Pertinent Vitals/Pain Pain Assessment: 0-10 Pain Score: 2  Pain Location: right thigh Pain Descriptors / Indicators: Burning;Sore Pain Intervention(s): Monitored during session;Premedicated before  session;Repositioned;Ice applied    Home Living Family/patient expects to be discharged to:: Private residence Living Arrangements: Spouse/significant other Available Help at Discharge: Family Type of Home: House Home Access: Stairs to enter Entrance Stairs-Rails: None Entrance Stairs-Number of Steps: 4 Home Layout: Two level;Able to live on main level with bedroom/bathroom Home Equipment: Gilford Rile - 2 wheels;Bedside commode      Prior Function Level of Independence: Independent               Hand Dominance        Extremity/Trunk Assessment   Upper Extremity Assessment: Overall WFL for tasks assessed           Lower Extremity Assessment: RLE deficits/detail RLE Deficits / Details: advances the leg    Cervical / Trunk Assessment: Normal  Communication   Communication: No difficulties  Cognition Arousal/Alertness: Awake/alert Behavior During Therapy: WFL for tasks assessed/performed Overall Cognitive Status: Within Functional Limits for tasks assessed                      General Comments      Exercises Total Joint Exercises Ankle Circles/Pumps: AROM;Both;10 reps Short Arc Quad: AROM;Right;10 reps Heel Slides: AAROM;Right;10 reps Hip ABduction/ADduction: AROM;AAROM;Right;10 reps   Assessment/Plan    PT Assessment Patient needs continued PT services  PT Problem List Decreased strength;Decreased activity tolerance;Decreased range of motion;Decreased mobility;Cardiopulmonary status limiting activity;Decreased knowledge of precautions;Decreased safety awareness;Decreased knowledge of use of DME  PT Treatment Interventions DME instruction;Gait training;Stair training;Functional mobility training;Therapeutic activities;Therapeutic exercise;Patient/family education    PT Goals (Current goals can be found in the Care Plan section)  Acute Rehab PT Goals Patient Stated Goal: to walk PT Goal Formulation: With patient/family Time For Goal  Achievement: 03/18/16 Potential to Achieve Goals: Good    Frequency 7X/week   Barriers to discharge        Co-evaluation               End of Session   Activity Tolerance: Patient tolerated treatment well Patient left: in chair;with call bell/phone within reach;with family/visitor present Nurse Communication: Mobility status         Time: [id number] PT Time Calculation (min) (ACUTE ONLY): 26 min   Charges:   PT Evaluation $PT Eval Low Complexity: 1 Procedure PT Treatments $Gait Training: 8-22 mins   PT G Codes:        [name] 03/16/2016, 10:17 AM

## 2016-03-16 NOTE — Consult Note (Signed)
   [facility] CM Inpatient Consult   03/16/2016  Alexander Carney July 20, 1960 [id number]   Came to visit Alexander Carney on behalf of Link to [facility] Care Management program for [employer] employees/dependents with [facility] insurance. His wife is an Glass blower/designer at [employer]. Discussed Link to Wellness program. Denies any Link to Wellness needs at this time. Provided Link to [employer], 24-hr nurse line magnet, and contact information. Confirmed best contact number as [phone] for post discharge call. Appreciative of visit.   Will refer for post discharge call.   Marthenia Rolling, MSN-Ed, RN,BSN [facility] Liaison [phone]

## 2016-03-16 NOTE — Progress Notes (Signed)
Physical Therapy Treatment Patient Details Name: Alexander Carney MRN: [id number] DOB: 10-15-1960 Today's Date: 03/16/2016    History of Present Illness Alexander Carney    PT Comments    Progressing well.  Continue PT while in acute care.  Follow Up Recommendations  Home health PT;Supervision/Assistance - 24 hour     Equipment Recommendations  None recommended by PT    Recommendations for Other Services       Precautions / Restrictions Precautions Precautions: Fall    Mobility  Bed Mobility   Bed Mobility: Sit to Supine       Sit to supine: Min assist   General bed mobility comments: assist the  right leg  Transfers   Equipment used: Rolling walker (2 wheeled) Transfers: Sit to/from Stand Sit to Stand: Supervision            Ambulation/Gait Ambulation/Gait assistance: Supervision Ambulation Distance (Feet): 400 Feet Assistive device: Rolling walker (2 wheeled) Gait Pattern/deviations: Step-through pattern;Step-to pattern         Stairs Stairs: Yes   Stair Management: One rail Left;Step to pattern;Forwards;With cane Number of Stairs: 4 General stair comments: cues for safety, some difficulty advancing right leg down   Wheelchair Mobility    Modified Rankin (Stroke Patients Only)       Balance                                    Cognition Arousal/Alertness: Awake/alert                          Exercises      General Comments        Pertinent Vitals/Pain Pain Score: 3  Pain Location: right thigh Pain Descriptors / Indicators: Burning Pain Intervention(s): Monitored during session;Premedicated before session    Home Living                      Prior Function            PT Goals (current goals can now be found in the care plan section) Progress towards PT goals: Progressing toward goals    Frequency    7X/week      PT Plan Current plan remains appropriate    Co-evaluation             End of Session   Activity Tolerance: Patient tolerated treatment well Patient left: in bed;with call bell/phone within reach;with family/visitor present     Time: [id number] PT Time Calculation (min) (ACUTE ONLY): 26 min  Charges:  $Gait Training: 23-37 mins                    G Codes:      Alexander Carney 03/16/2016, 4:39 PM

## 2016-03-16 NOTE — Progress Notes (Signed)
   Subjective: 1 Day Post-Op Procedure(s) (LRB): RIGHT TOTAL HIP ARTHROPLASTY ANTERIOR APPROACH (Right) Patient reports pain as mild.   Patient seen in rounds for Dr. Wynelle Link. Wife in room at bedside. Patient is well, but has had some minor complaints of pain in the hip, requiring pain medications We will start therapy today.  If they do well with therapy and meets all goals, then will allow home later this afternoon following therapy. Plan is to go Home after hospital stay.  Objective: Vital signs in last 24 hours: Temp:  [97.4 F (36.3 C)-99.4 F (37.4 C)] 98.4 F (36.9 C) (12/07 0542) Pulse Rate:  [54-88] 68 (12/07 0542) Resp:  [12-18] 16 (12/07 0542) BP: (78-165)/(55-89) 145/63 (12/07 0542) SpO2:  [96 %-100 %] 98 % (12/07 0159) Weight:  [103 kg (227 lb)] 103 kg (227 lb) (12/06 1047)  Intake/Output from previous day:  Intake/Output Summary (Last 24 hours) at 03/16/16 0845 Last data filed at 03/16/16 0600  Gross per 24 hour  Intake             3935 ml  Output             2885 ml  Net             1050 ml    Intake/Output this shift: No intake/output data recorded.  Labs:  Recent Labs  03/16/16 0417  HGB 13.6    Recent Labs  03/16/16 0417  WBC 16.4*  RBC 4.31  HCT 39.3  PLT 223    Recent Labs  03/16/16 0417  NA 138  K 3.6  CL 105  CO2 26  BUN 14  CREATININE 0.93  GLUCOSE 142*  CALCIUM 8.1*   No results for input(s): LABPT, INR in the last 72 hours.  EXAM General - Patient is Alert, Appropriate and Oriented Extremity - Neurovascular intact Sensation intact distally Intact pulses distally Dorsiflexion/Plantar flexion intact Dressing - dressing C/D/I Motor Function - intact, moving foot and toes well on exam.  Hemovac pulled without difficulty.  Past Medical History:  Diagnosis Date  . Arthritis    right hip  . Blind left eye   . Closed dislocation of knee, unspecified part 1980's   left x2  . Headache(784.0)    occasionally  .  Hyperlipidemia    takes Lipitor daily  . Hypertension   . Joint pain    right hip  . Retinal tear of both eyes   . Right cataract    History surgery- 2015     . Seizures ([place])    last one in 01/2007; takes Tegretol daily    Assessment/Plan: 1 Day Post-Op Procedure(s) (LRB): RIGHT TOTAL HIP ARTHROPLASTY ANTERIOR APPROACH (Right) Active Problems:   OA (osteoarthritis) of hip  Estimated body mass index is 32.11 kg/m as calculated from the following:   Height as of this encounter: 5' 10.5" (1.791 m).   Weight as of this encounter: 103 kg (227 lb). Up with therapy Discharge home with home health  DVT Prophylaxis - Aspirin 325 mg BID Weight Bearing As Tolerated right Leg Hemovac Pulled Begin Therapy  If meets goals and able to go home: Discharge home with home health Diet - Cardiac diet Follow up - in 2 weeks Activity - WBAT Disposition - Home Condition Upon Discharge - Good D/C Meds - See DC Summary DVT Prophylaxis - Aspirin 325 mg BID  Arlee Muslim, PA-C Orthopaedic Surgery 03/16/2016, 8:45 AM

## 2016-03-17 LAB — CBC
HEMATOCRIT: 39.6 % (ref 39.0–52.0)
Hemoglobin: 13.4 g/dL (ref 13.0–17.0)
MCH: 31.2 pg (ref 26.0–34.0)
MCHC: 33.8 g/dL (ref 30.0–36.0)
MCV: 92.1 fL (ref 78.0–100.0)
PLATELETS: 232 10*3/uL (ref 150–400)
RBC: 4.3 MIL/uL (ref 4.22–5.81)
RDW: 12.9 % (ref 11.5–15.5)
WBC: 16.7 10*3/uL — AB (ref 4.0–10.5)

## 2016-03-17 LAB — BASIC METABOLIC PANEL
ANION GAP: 7 (ref 5–15)
BUN: 15 mg/dL (ref 6–20)
CALCIUM: 8.3 mg/dL — AB (ref 8.9–10.3)
CO2: 27 mmol/L (ref 22–32)
CREATININE: 0.93 mg/dL (ref 0.61–1.24)
Chloride: 105 mmol/L (ref 101–111)
Glucose, Bld: 106 mg/dL — ABNORMAL HIGH (ref 65–99)
Potassium: 3.4 mmol/L — ABNORMAL LOW (ref 3.5–5.1)
SODIUM: 139 mmol/L (ref 135–145)

## 2016-03-17 MED ORDER — POTASSIUM CHLORIDE CRYS ER 20 MEQ PO TBCR
40.0000 meq | EXTENDED_RELEASE_TABLET | ORAL | Status: AC
  Administered 2016-03-17 (×2): 40 meq via ORAL
  Filled 2016-03-17 (×2): qty 2

## 2016-03-17 NOTE — Discharge Instructions (Signed)
° °Dr. Frank [name] °Total Joint Specialist °[facility] °[address] °[place], [place] [address] °[phone] ° °ANTERIOR APPROACH TOTAL HIP REPLACEMENT POSTOPERATIVE DIRECTIONS ° ° °Hip Rehabilitation, Guidelines Following Surgery  °The results of a hip operation are greatly improved after range of motion and muscle strengthening exercises. Follow all safety measures which are given to protect your hip. If any of these exercises cause increased pain or swelling in your joint, decrease the amount until you are comfortable again. Then slowly increase the exercises. Call your caregiver if you have problems or questions.  ° °HOME CARE INSTRUCTIONS  °Remove items at home which could result in a fall. This includes throw rugs or furniture in walking pathways.  °· ICE to the affected hip every three hours for 30 minutes at a time and then as needed for pain and swelling.  Continue to use ice on the hip for pain and swelling from surgery. You may notice swelling that will progress down to the foot and ankle.  This is normal after surgery.  Elevate the leg when you are not up walking on it.   °· Continue to use the breathing machine which will help keep your temperature down.  It is common for your temperature to cycle up and down following surgery, especially at night when you are not up moving around and exerting yourself.  The breathing machine keeps your lungs expanded and your temperature down. ° ° °DIET °You may resume your previous home diet once your are discharged from the hospital. ° °DRESSING / WOUND CARE / SHOWERING °You may shower 3 days after surgery, but keep the wounds dry during showering.  You may use an occlusive plastic wrap (Press'n Seal for example), NO SOAKING/SUBMERGING IN THE BATHTUB.  If the bandage gets wet, change with a clean dry gauze.  If the incision gets wet, pat the wound dry with a clean towel. °You may start showering once you are discharged home but do not  submerge the incision under water. Just pat the incision dry and apply a dry gauze dressing on daily. °Change the surgical dressing daily and reapply a dry dressing each time. ° °ACTIVITY °Walk with your walker as instructed. °Use walker as long as suggested by your caregivers. °Avoid periods of inactivity such as sitting longer than an hour when not asleep. This helps prevent blood clots.  °You may resume a sexual relationship in one month or when given the OK by your doctor.  °You may return to work once you are cleared by your doctor.  °Do not drive a car for 6 weeks or until released by you surgeon.  °Do not drive while taking narcotics. ° °WEIGHT BEARING °Weight bearing as tolerated with assist device (walker, cane, etc) as directed, use it as long as suggested by your surgeon or therapist, typically at least 4-6 weeks. ° °POSTOPERATIVE CONSTIPATION PROTOCOL °Constipation - defined medically as fewer than three stools per week and severe constipation as less than one stool per week. ° °One of the most common issues patients have following surgery is constipation.  Even if you have a regular bowel pattern at home, your normal regimen is likely to be disrupted due to multiple reasons following surgery.  Combination of anesthesia, postoperative narcotics, change in appetite and fluid intake all can affect your bowels.  In order to avoid complications following surgery, here are some recommendations in order to help you during your recovery period. ° °Colace (docusate) - Pick up an over-the-counter   form of Colace or another stool softener and take twice a day as long as you are requiring postoperative pain medications.  Take with a full glass of water daily.  If you experience loose stools or diarrhea, hold the colace until you stool forms back up.  If your symptoms do not get better within 1 week or if they get worse, check with your doctor. ° °Dulcolax (bisacodyl) - Pick up over-the-counter and take as directed  by the product packaging as needed to assist with the movement of your bowels.  Take with a full glass of water.  Use this product as needed if not relieved by Colace only.  ° °MiraLax (polyethylene glycol) - Pick up over-the-counter to have on hand.  MiraLax is a solution that will increase the amount of water in your bowels to assist with bowel movements.  Take as directed and can mix with a glass of water, juice, soda, coffee, or tea.  Take if you go more than two days without a movement. °Do not use MiraLax more than once per day. Call your doctor if you are still constipated or irregular after using this medication for 7 days in a row. ° °If you continue to have problems with postoperative constipation, please contact the office for further assistance and recommendations.  If you experience "the worst abdominal pain ever" or develop nausea or vomiting, please contact the office immediatly for further recommendations for treatment. ° °ITCHING ° If you experience itching with your medications, try taking only a single pain pill, or even half a pain pill at a time.  You can also use Benadryl over the counter for itching or also to help with sleep.  ° °TED HOSE STOCKINGS °Wear the elastic stockings on both legs for three weeks following surgery during the day but you may remove then at night for sleeping. ° °MEDICATIONS °See your medication summary on the “After Visit Summary” that the nursing staff will review with you prior to discharge.  You may have some home medications which will be placed on hold until you complete the course of blood thinner medication.  It is important for you to complete the blood thinner medication as prescribed by your surgeon.  Continue your approved medications as instructed at time of discharge. ° °PRECAUTIONS °If you experience chest pain or shortness of breath - call 911 immediately for transfer to the hospital emergency department.  °If you develop a fever greater that 101 F,  purulent drainage from wound, increased redness or drainage from wound, foul odor from the wound/dressing, or calf pain - CONTACT YOUR SURGEON.   °                                                °FOLLOW-UP APPOINTMENTS °Make sure you keep all of your appointments after your operation with your surgeon and caregivers. You should call the office at the above phone number and make an appointment for approximately two weeks after the date of your surgery or on the date instructed by your surgeon outlined in the "After Visit Summary". ° °RANGE OF MOTION AND STRENGTHENING EXERCISES  °These exercises are designed to help you keep full movement of your hip joint. Follow your caregiver's or physical therapist's instructions. Perform all exercises about fifteen times, three times per day or as directed. Exercise both hips, even if you   have had only one joint replacement. These exercises can be done on a training (exercise) mat, on the floor, on a table or on a bed. Use whatever works the best and is most comfortable for you. Use music or television while you are exercising so that the exercises are a pleasant break in your day. This will make your life better with the exercises acting as a break in routine you can look forward to.  Lying on your back, slowly slide your foot toward your buttocks, raising your knee up off the floor. Then slowly slide your foot back down until your leg is straight again.  Lying on your back spread your legs as far apart as you can without causing discomfort.  Lying on your side, raise your upper leg and foot straight up from the floor as far as is comfortable. Slowly lower the leg and repeat.  Lying on your back, tighten up the muscle in the front of your thigh (quadriceps muscles). You can do this by keeping your leg straight and trying to raise your heel off the floor. This helps strengthen the largest muscle supporting your knee.  Lying on your back, tighten up the muscles of your  buttocks both with the legs straight and with the knee bent at a comfortable angle while keeping your heel on the floor.   IF YOU ARE TRANSFERRED TO A SKILLED REHAB FACILITY If the patient is transferred to a skilled rehab facility following release from the hospital, a list of the current medications will be sent to the facility for the patient to continue.  When discharged from the skilled rehab facility, please have the facility set up the patient's [place] prior to being released. Also, the skilled facility will be responsible for providing the patient with their medications at time of release from the facility to include their pain medication, the muscle relaxants, and their blood thinner medication. If the patient is still at the rehab facility at time of the two week follow up appointment, the skilled rehab facility will also need to assist the patient in arranging follow up appointment in our office and any transportation needs.  MAKE SURE YOU:  Understand these instructions.  Get help right away if you are not doing well or get worse.    Pick up stool softner and laxative for home use following surgery while on pain medications. Do not submerge incision under water. Please use good hand washing techniques while changing dressing each day. May shower starting three days after surgery. Please use a clean towel to pat the incision dry following showers. Continue to use ice for pain and swelling after surgery. Do not use any lotions or creams on the incision until instructed by your surgeon.  Take a full dose 325 mg Aspirin twice a day for three weeks then reduce back to a daily 81 mg Aspirin daily.

## 2016-03-17 NOTE — Progress Notes (Signed)
   Subjective: 2 Days Post-Op Procedure(s) (LRB): RIGHT TOTAL HIP ARTHROPLASTY ANTERIOR APPROACH (Right) Patient reports pain as mild.   Patient seen in rounds with Dr. Wynelle Link. Patient is well, and has had no acute complaints or problems Patient is ready to go home today  Objective: Vital signs in last 24 hours: Temp:  [98.7 F (37.1 C)-99.2 F (37.3 C)] 99.2 F (37.3 C) (12/08 0458) Pulse Rate:  [57-74] 74 (12/08 0458) Resp:  [16] 16 (12/08 0458) BP: (119-141)/(63-77) 130/68 (12/08 0458) SpO2:  [95 %-99 %] 95 % (12/08 0458)  Intake/Output from previous day:  Intake/Output Summary (Last 24 hours) at 03/17/16 0714 Last data filed at 03/17/16 0600  Gross per 24 hour  Intake              960 ml  Output                0 ml  Net              960 ml    Intake/Output this shift: No intake/output data recorded.  Labs:  Recent Labs  03/16/16 0417 03/17/16 0422  HGB 13.6 13.4    Recent Labs  03/16/16 0417 03/17/16 0422  WBC 16.4* 16.7*  RBC 4.31 4.30  HCT 39.3 39.6  PLT 223 232    Recent Labs  03/16/16 0417 03/17/16 0422  NA 138 139  K 3.6 3.4*  CL 105 105  CO2 26 27  BUN 14 15  CREATININE 0.93 0.93  GLUCOSE 142* 106*  CALCIUM 8.1* 8.3*   No results for input(s): LABPT, INR in the last 72 hours.  EXAM: General - Patient is Alert, Appropriate and Oriented Extremity - Neurovascular intact Sensation intact distally Intact pulses distally Dorsiflexion/Plantar flexion intact Incision - clean, dry, healing Motor Function - intact, moving foot and toes well on exam.   Assessment/Plan: 2 Days Post-Op Procedure(s) (LRB): RIGHT TOTAL HIP ARTHROPLASTY ANTERIOR APPROACH (Right) Procedure(s) (LRB): RIGHT TOTAL HIP ARTHROPLASTY ANTERIOR APPROACH (Right) Past Medical History:  Diagnosis Date  . Arthritis    right hip  . Blind left eye   . Closed dislocation of knee, unspecified part 1980's   left x2  . Headache(784.0)    occasionally  . Hyperlipidemia     takes Lipitor daily  . Hypertension   . Joint pain    right hip  . Retinal tear of both eyes   . Right cataract    History surgery- 2015     . Seizures ([place])    last one in 01/2007; takes Tegretol daily   Active Problems:   OA (osteoarthritis) of hip  Estimated body mass index is 32.11 kg/m as calculated from the following:   Height as of this encounter: 5' 10.5" (1.791 m).   Weight as of this encounter: 103 kg (227 lb). Up with therapy Discharge home with home health Diet - Cardiac diet Follow up - in 2 weeks Activity - WBAT Disposition - Home Condition Upon Discharge - Good D/C Meds - See DC Summary DVT Prophylaxis - Aspirin 325 mg BID  Arlee Muslim, PA-C Orthopaedic Surgery 03/17/2016, 7:14 AM

## 2016-03-17 NOTE — Progress Notes (Signed)
Physical Therapy Treatment Patient Details Name: SYERE GOLDNER MRN: [id number] DOB: 01/23/1961 Today's Date: 03/17/2016    History of Present Illness [name]    PT Comments    Ready for DC.  Follow Up Recommendations        Equipment Recommendations       Recommendations for Other Services       Precautions / Restrictions Precautions Precautions: Fall    Mobility  Bed Mobility Overal bed mobility: Modified Independent                Transfers Overall transfer level: Modified independent                  Ambulation/Gait Ambulation/Gait assistance: Supervision Ambulation Distance (Feet): 200 Feet Assistive device: Rolling walker (2 wheeled) Gait Pattern/deviations: Step-to pattern;Step-through pattern         Stairs            Wheelchair Mobility    Modified Rankin (Stroke Patients Only)       Balance                                    Cognition Arousal/Alertness: Awake/alert                          Exercises Total Joint Exercises Ankle Circles/Pumps: AROM;Both;10 reps Quad Sets: AROM;Both;10 reps Short Arc Quad: AROM;Right;10 reps Heel Slides: AROM;Right;10 reps Hip ABduction/ADduction: AROM;Right Long Arc Quad: AROM;Right;10 reps    General Comments        Pertinent Vitals/Pain Pain Score: 2  Pain Location: right thigh Pain Descriptors / Indicators: Burning Pain Intervention(s): Premedicated before session;Ice applied    Home Living                      Prior Function            PT Goals (current goals can now be found in the care plan section)      Frequency           PT Plan      Co-evaluation             End of Session           Time:  -     Charges:                       G Codes:      [name] 03/17/2016, 12:54 PM

## 2016-03-17 NOTE — Discharge Summary (Signed)
Physician Discharge Summary   Patient ID: Alexander Carney MRN: [id number] DOB/AGE: 53-Jul-1962 53 y.o.  Admit date: 03/15/2016 Discharge date: 03-17-2016  Primary Diagnosis:  Osteoarthritis of the Right hip.   Admission Diagnoses:  Past Medical History:  Diagnosis Date  . Arthritis    right hip  . Blind left eye   . Closed dislocation of knee, unspecified part 1980's   left x2  . Headache(784.0)    occasionally  . Hyperlipidemia    takes Lipitor daily  . Hypertension   . Joint pain    right hip  . Retinal tear of both eyes   . Right cataract    History surgery- 2015     . Seizures ([place])    last one in 01/2007; takes Tegretol daily   Discharge Diagnoses:   Active Problems:   OA (osteoarthritis) of hip  Estimated body mass index is 32.11 kg/m as calculated from the following:   Height as of this encounter: 5' 10.5" (1.791 m).   Weight as of this encounter: 103 kg (227 lb).  Procedure(s) (LRB): RIGHT TOTAL HIP ARTHROPLASTY ANTERIOR APPROACH (Right)   Consults: None  HPI: Alexander Carney is a 55 y.o. male who has advanced end-  stage arthritis of their Right  hip with progressively worsening pain and  dysfunction.The patient has failed nonoperative management and presents for  total hip arthroplasty.   Laboratory Data: Admission on 03/15/2016  Component Date Value Ref Range Status  . WBC 03/16/2016 16.4* 4.0 - 10.5 K/uL Final  . RBC 03/16/2016 4.31  4.22 - 5.81 MIL/uL Final  . Hemoglobin 03/16/2016 13.6  13.0 - 17.0 g/dL Final  . HCT 03/16/2016 39.3  39.0 - 52.0 % Final  . MCV 03/16/2016 91.2  78.0 - 100.0 fL Final  . MCH 03/16/2016 31.6  26.0 - 34.0 pg Final  . MCHC 03/16/2016 34.6  30.0 - 36.0 g/dL Final  . RDW 03/16/2016 12.5  11.5 - 15.5 % Final  . Platelets 03/16/2016 223  150 - 400 K/uL Final  . Sodium 03/16/2016 138  135 - 145 mmol/L Final  . Potassium 03/16/2016 3.6  3.5 - 5.1 mmol/L Final  . Chloride 03/16/2016 105  101 - 111 mmol/L Final  . CO2  03/16/2016 26  22 - 32 mmol/L Final  . Glucose, Bld 03/16/2016 142* 65 - 99 mg/dL Final  . BUN 03/16/2016 14  6 - 20 mg/dL Final  . Creatinine, Ser 03/16/2016 0.93  0.61 - 1.24 mg/dL Final  . Calcium 03/16/2016 8.1* 8.9 - 10.3 mg/dL Final  . GFR calc non Af Amer 03/16/2016 >60  >60 mL/min Final  . GFR calc Af Amer 03/16/2016 >60  >60 mL/min Final   Comment: (NOTE) The eGFR has been calculated using the CKD EPI equation. This calculation has not been validated in all clinical situations. eGFR's persistently <60 mL/min signify possible Chronic Kidney Disease.   . Anion gap 03/16/2016 7  5 - 15 Final  . WBC 03/17/2016 16.7* 4.0 - 10.5 K/uL Final  . RBC 03/17/2016 4.30  4.22 - 5.81 MIL/uL Final  . Hemoglobin 03/17/2016 13.4  13.0 - 17.0 g/dL Final  . HCT 03/17/2016 39.6  39.0 - 52.0 % Final  . MCV 03/17/2016 92.1  78.0 - 100.0 fL Final  . MCH 03/17/2016 31.2  26.0 - 34.0 pg Final  . MCHC 03/17/2016 33.8  30.0 - 36.0 g/dL Final  . RDW 03/17/2016 12.9  11.5 - 15.5 % Final  . Platelets 03/17/2016  232  150 - 400 K/uL Final  . Sodium 03/17/2016 139  135 - 145 mmol/L Final  . Potassium 03/17/2016 3.4* 3.5 - 5.1 mmol/L Final  . Chloride 03/17/2016 105  101 - 111 mmol/L Final  . CO2 03/17/2016 27  22 - 32 mmol/L Final  . Glucose, Bld 03/17/2016 106* 65 - 99 mg/dL Final  . BUN 03/17/2016 15  6 - 20 mg/dL Final  . Creatinine, Ser 03/17/2016 0.93  0.61 - 1.24 mg/dL Final  . Calcium 03/17/2016 8.3* 8.9 - 10.3 mg/dL Final  . GFR calc non Af Amer 03/17/2016 >60  >60 mL/min Final  . GFR calc Af Amer 03/17/2016 >60  >60 mL/min Final   Comment: (NOTE) The eGFR has been calculated using the CKD EPI equation. This calculation has not been validated in all clinical situations. eGFR's persistently <60 mL/min signify possible Chronic Kidney Disease.   Georgiann Hahn gap 03/17/2016 7  5 - 15 Final  Hospital Outpatient Visit on 03/09/2016  Component Date Value Ref Range Status  . aPTT 03/09/2016 30  24 - 36  seconds Final  . WBC 03/09/2016 7.4  4.0 - 10.5 K/uL Final  . RBC 03/09/2016 5.04  4.22 - 5.81 MIL/uL Final  . Hemoglobin 03/09/2016 15.8  13.0 - 17.0 g/dL Final  . HCT 03/09/2016 44.3  39.0 - 52.0 % Final  . MCV 03/09/2016 87.9  78.0 - 100.0 fL Final  . MCH 03/09/2016 31.3  26.0 - 34.0 pg Final  . MCHC 03/09/2016 35.7  30.0 - 36.0 g/dL Final  . RDW 03/09/2016 12.3  11.5 - 15.5 % Final  . Platelets 03/09/2016 233  150 - 400 K/uL Final  . Sodium 03/09/2016 138  135 - 145 mmol/L Final  . Potassium 03/09/2016 3.7  3.5 - 5.1 mmol/L Final  . Chloride 03/09/2016 102  101 - 111 mmol/L Final  . CO2 03/09/2016 28  22 - 32 mmol/L Final  . Glucose, Bld 03/09/2016 93  65 - 99 mg/dL Final  . BUN 03/09/2016 13  6 - 20 mg/dL Final  . Creatinine, Ser 03/09/2016 0.95  0.61 - 1.24 mg/dL Final  . Calcium 03/09/2016 8.8* 8.9 - 10.3 mg/dL Final  . Total Protein 03/09/2016 7.4  6.5 - 8.1 g/dL Final  . Albumin 03/09/2016 4.6  3.5 - 5.0 g/dL Final  . AST 03/09/2016 26  15 - 41 U/L Final  . ALT 03/09/2016 31  17 - 63 U/L Final  . Alkaline Phosphatase 03/09/2016 92  38 - 126 U/L Final  . Total Bilirubin 03/09/2016 0.8  0.3 - 1.2 mg/dL Final  . GFR calc non Af Amer 03/09/2016 >60  >60 mL/min Final  . GFR calc Af Amer 03/09/2016 >60  >60 mL/min Final   Comment: (NOTE) The eGFR has been calculated using the CKD EPI equation. This calculation has not been validated in all clinical situations. eGFR's persistently <60 mL/min signify possible Chronic Kidney Disease.   . Anion gap 03/09/2016 8  5 - 15 Final  . Prothrombin Time 03/09/2016 14.1  11.4 - 15.2 seconds Final  . INR 03/09/2016 1.08   Final  . ABO/RH(D) 03/15/2016 B POS   Final  . Antibody Screen 03/15/2016 NEG   Final  . Sample Expiration 03/15/2016 03/18/2016   Final  . Extend sample reason 03/15/2016 NO TRANSFUSIONS OR PREGNANCY IN THE PAST 3 MONTHS   Final  . Color, Urine 03/09/2016 YELLOW  YELLOW Final  . APPearance 03/09/2016 CLEAR  CLEAR Final    .  Specific Gravity, Urine 03/09/2016 1.014  1.005 - 1.030 Final  . pH 03/09/2016 7.5  5.0 - 8.0 Final  . Glucose, UA 03/09/2016 NEGATIVE  NEGATIVE mg/dL Final  . Hgb urine dipstick 03/09/2016 NEGATIVE  NEGATIVE Final  . Bilirubin Urine 03/09/2016 NEGATIVE  NEGATIVE Final  . Ketones, ur 03/09/2016 NEGATIVE  NEGATIVE mg/dL Final  . Protein, ur 03/09/2016 NEGATIVE  NEGATIVE mg/dL Final  . Nitrite 03/09/2016 NEGATIVE  NEGATIVE Final  . Leukocytes, UA 03/09/2016 NEGATIVE  NEGATIVE Final  . MRSA, PCR 03/09/2016 NEGATIVE  NEGATIVE Final  . Staphylococcus aureus 03/09/2016 POSITIVE* NEGATIVE Final   Comment:        The Xpert SA Assay (FDA approved for NASAL specimens in patients over 47 years of age), is one component of a comprehensive surveillance program.  Test performance has been validated by [facility] for patients greater than or equal to 28 year old. It is not intended to diagnose infection nor to guide or monitor treatment.   . ABO/RH(D) 03/09/2016 B POS   Final     X-Rays:Dg Pelvis Portable  Result Date: 03/15/2016 CLINICAL DATA:  Post right hip replacement EXAM: PORTABLE PELVIS 1-2 VIEWS COMPARISON:  Pelvis film of 03/22/2012 FINDINGS: A right total hip replacement has been performed. The femoral and acetabular components are in good position. No complicating features is seen. A previous left total hip replacement is unchanged in position. The pelvic rami are intact. The SI joints are corticated. IMPRESSION: New right total hip replacement components in good position. No complicating features. Electronically Signed   By: Ivar Drape M.D.   On: 03/15/2016 16:56   Dg C-arm 1-60 Min-no Report  Result Date: 03/15/2016 There is no report for this exam.   EKG: Orders placed or performed during the hospital encounter of 03/09/16  . EKG 12-Lead  . EKG 12-Lead     Hospital Course: Patient was admitted to [facility] and taken to the OR and underwent the above state  procedure without complications.  Patient tolerated the procedure well and was later transferred to the recovery room and then to the orthopaedic floor for postoperative care.  They were given PO and IV analgesics for pain control following their surgery.  They were given 24 hours of postoperative antibiotics of  Anti-infectives    Start     Dose/Rate Route Frequency Ordered Stop   03/15/16 2030  ceFAZolin (ANCEF) IVPB 2g/100 mL premix     2 g 200 mL/hr over 30 Minutes Intravenous Every 6 hours 03/15/16 1846 03/16/16 0249   03/15/16 1105  ceFAZolin (ANCEF) IVPB 2g/100 mL premix     2 g 200 mL/hr over 30 Minutes Intravenous On call to O.R. 03/15/16 1105 03/15/16 1430     and started on DVT prophylaxis in the form of Xarelto.   PT and OT were ordered for total hip protocol.  The patient was allowed to be WBAT with therapy. Discharge planning was consulted to help with postop disposition and equipment needs.  Patient had a good night on the evening of surgery.  They started to get up OOB with therapy on day one.  Hemovac drain was pulled without difficulty.  Continued to work with therapy into day two.  Dressing was changed on day two and the incision was healing well. Patient was seen in rounds by Dr. Wynelle Link and was ready to go home on POD 2.  Discharge home with home health Diet - Cardiac diet Follow up - in 2 weeks Activity -  WBAT Disposition - Home Condition Upon Discharge - Good D/C Meds - See DC Summary DVT Prophylaxis - Aspirin 325 mg BID for three weeks and then resume the daily 81 mg Aspirin daily.  Discharge Instructions    AMB Referral to [place] Management    Complete by:  As directed    Please assign UMR member for post discharge call. To discharge home today. Packet provided. Please call with questions. Thanks. Marthenia Rolling, MSN-Ed, [facility] [phone]   Reason for consult:  Please assign UMR member for post discharge call.   Expected date of  contact:  1-3 days (reserved for hospital discharges)   Call MD / Call 911    Complete by:  As directed    If you experience chest pain or shortness of breath, CALL 911 and be transported to the hospital emergency room.  If you develope a fever above 101 F, pus (white drainage) or increased drainage or redness at the wound, or calf pain, call your surgeon's office.   Change dressing    Complete by:  As directed    You may change your dressing dressing daily with sterile 4 x 4 inch gauze dressing and paper tape.  Do not submerge the incision under water.   Constipation Prevention    Complete by:  As directed    Drink plenty of fluids.  Prune juice may be helpful.  You may use a stool softener, such as Colace (over the counter) 100 mg twice a day.  Use MiraLax (over the counter) for constipation as needed.   Diet - low sodium heart healthy    Complete by:  As directed    Discharge instructions    Complete by:  As directed    Pick up stool softner and laxative for home use following surgery while on pain medications. Do not submerge incision under water. Please use good hand washing techniques while changing dressing each day. May shower starting three days after surgery. Please use a clean towel to pat the incision dry following showers. Continue to use ice for pain and swelling after surgery. Do not use any lotions or creams on the incision until instructed by your surgeon.   Postoperative Constipation Protocol  Constipation - defined medically as fewer than three stools per week and severe constipation as less than one stool per week.  One of the most common issues patients have following surgery is constipation.  Even if you have a regular bowel pattern at home, your normal regimen is likely to be disrupted due to multiple reasons following surgery.  Combination of anesthesia, postoperative narcotics, change in appetite and fluid intake all can affect your bowels.  In order to avoid  complications following surgery, here are some recommendations in order to help you during your recovery period.  Colace (docusate) - Pick up an over-the-counter form of Colace or another stool softener and take twice a day as long as you are requiring postoperative pain medications.  Take with a full glass of water daily.  If you experience loose stools or diarrhea, hold the colace until you stool forms back up.  If your symptoms do not get better within 1 week or if they get worse, check with your doctor.  Dulcolax (bisacodyl) - Pick up over-the-counter and take as directed by the product packaging as needed to assist with the movement of your bowels.  Take with a full glass of water.  Use this product as needed if not relieved by  Colace only.   MiraLax (polyethylene glycol) - Pick up over-the-counter to have on hand.  MiraLax is a solution that will increase the amount of water in your bowels to assist with bowel movements.  Take as directed and can mix with a glass of water, juice, soda, coffee, or tea.  Take if you go more than two days without a movement. Do not use MiraLax more than once per day. Call your doctor if you are still constipated or irregular after using this medication for 7 days in a row.  If you continue to have problems with postoperative constipation, please contact the office for further assistance and recommendations.  If you experience "the worst abdominal pain ever" or develop nausea or vomiting, please contact the office immediatly for further recommendations for treatment.   Take a full dose enteric coated Aspirin 325 mg twice a day for three weeks, then reduce back to the daily 81 mg Aspirin.   Do not sit on low chairs, stoools or toilet seats, as it may be difficult to get up from low surfaces    Complete by:  As directed    Driving restrictions    Complete by:  As directed    No driving until released by the physician.   Increase activity slowly as tolerated     Complete by:  As directed    Lifting restrictions    Complete by:  As directed    No lifting until released by the physician.   Patient may shower    Complete by:  As directed    You may shower without a dressing once there is no drainage.  Do not wash over the wound.  If drainage remains, do not shower until drainage stops.   TED hose    Complete by:  As directed    Use stockings (TED hose) for 3 weeks on both leg(s).  You may remove them at night for sleeping.   Weight bearing as tolerated    Complete by:  As directed    Laterality:  right   Extremity:  Lower       Medication List    STOP taking these medications   aspirin 81 MG chewable tablet   multivitamin with minerals Tabs tablet     TAKE these medications   atorvastatin 20 MG tablet Commonly known as:  LIPITOR Take 20 mg by mouth daily.   carbamazepine 200 MG tablet Commonly known as:  TEGRETOL Take 200 mg by mouth 2 (two) times daily.   EPIPEN 2-PAK 0.3 mg/0.3 mL Soaj injection Generic drug:  EPINEPHrine Inject 0.3 mg into the muscle daily as needed (for allergic reactions).   irbesartan-hydrochlorothiazide 300-12.5 MG tablet Commonly known as:  AVALIDE Take 1 tablet by mouth daily.   methocarbamol 500 MG tablet Commonly known as:  ROBAXIN Take 1 tablet (500 mg total) by mouth every 6 (six) hours as needed for muscle spasms.   traMADol 50 MG tablet Commonly known as:  ULTRAM Take 1-2 tablets (50-100 mg total) by mouth every 6 (six) hours as needed for moderate pain.      Follow-up Information    Gearlean Alf, MD. Schedule an appointment as soon as possible for a visit on 03/28/2016.   Specialty:  Orthopedic Surgery Contact information: [address] [place] [address] [phone]           Signed: Arlee Muslim, PA-C Orthopaedic Surgery 03/17/2016, 7:30 AM

## 2016-03-18 DIAGNOSIS — Z947 Corneal transplant status: Secondary | ICD-10-CM | POA: Diagnosis not present

## 2016-03-18 DIAGNOSIS — G40909 Epilepsy, unspecified, not intractable, without status epilepticus: Secondary | ICD-10-CM | POA: Diagnosis not present

## 2016-03-18 DIAGNOSIS — Z970 Presence of artificial eye: Secondary | ICD-10-CM | POA: Diagnosis not present

## 2016-03-18 DIAGNOSIS — Z96643 Presence of artificial hip joint, bilateral: Secondary | ICD-10-CM | POA: Diagnosis not present

## 2016-03-18 DIAGNOSIS — I10 Essential (primary) hypertension: Secondary | ICD-10-CM | POA: Diagnosis not present

## 2016-03-18 DIAGNOSIS — Z7982 Long term (current) use of aspirin: Secondary | ICD-10-CM | POA: Diagnosis not present

## 2016-03-18 DIAGNOSIS — Z79891 Long term (current) use of opiate analgesic: Secondary | ICD-10-CM | POA: Diagnosis not present

## 2016-03-18 DIAGNOSIS — Z471 Aftercare following joint replacement surgery: Secondary | ICD-10-CM | POA: Diagnosis not present

## 2016-03-20 DIAGNOSIS — Z947 Corneal transplant status: Secondary | ICD-10-CM | POA: Diagnosis not present

## 2016-03-20 DIAGNOSIS — G40909 Epilepsy, unspecified, not intractable, without status epilepticus: Secondary | ICD-10-CM | POA: Diagnosis not present

## 2016-03-20 DIAGNOSIS — Z471 Aftercare following joint replacement surgery: Secondary | ICD-10-CM | POA: Diagnosis not present

## 2016-03-20 DIAGNOSIS — Z970 Presence of artificial eye: Secondary | ICD-10-CM | POA: Diagnosis not present

## 2016-03-20 DIAGNOSIS — Z96643 Presence of artificial hip joint, bilateral: Secondary | ICD-10-CM | POA: Diagnosis not present

## 2016-03-20 DIAGNOSIS — I10 Essential (primary) hypertension: Secondary | ICD-10-CM | POA: Diagnosis not present

## 2016-03-20 DIAGNOSIS — Z7982 Long term (current) use of aspirin: Secondary | ICD-10-CM | POA: Diagnosis not present

## 2016-03-20 DIAGNOSIS — Z79891 Long term (current) use of opiate analgesic: Secondary | ICD-10-CM | POA: Diagnosis not present

## 2016-03-21 ENCOUNTER — Other Ambulatory Visit: Payer: Self-pay | Admitting: *Deleted

## 2016-03-21 NOTE — Patient Outreach (Addendum)
[facility]) Care Management  03/21/2016  HARLOW MCDERMOTT 1960-10-19 [id number]  Subjective: Telephone call to patient's home number, no answer, left HIPAA compliant voicemail message, and requested call back.   Objective: Per chart review: Patient hospitalized  03/15/16  - 03/17/16 for Osteoarthritis of the Righthip.    Status post Right total hip arthroplasty, anterior approach on 03/15/16.   Patient also has a history of hyperlipidemia.    Assessment: Received UMR Transition of care follow up referral on 03/16/16.   Transition of care follow up pending patient contact.   Plan: RNCM will call patient for 2nd telephone outreach attempt, transition of care follow up, within 10 business days, if no return call.   Nailea Whitehorn H. Annia Friendly, BSN, [place] [facility] Telephonic CM Phone: [phone] Fax: [phone]

## 2016-03-23 DIAGNOSIS — Z79891 Long term (current) use of opiate analgesic: Secondary | ICD-10-CM | POA: Diagnosis not present

## 2016-03-23 DIAGNOSIS — G40909 Epilepsy, unspecified, not intractable, without status epilepticus: Secondary | ICD-10-CM | POA: Diagnosis not present

## 2016-03-23 DIAGNOSIS — Z7982 Long term (current) use of aspirin: Secondary | ICD-10-CM | POA: Diagnosis not present

## 2016-03-23 DIAGNOSIS — I10 Essential (primary) hypertension: Secondary | ICD-10-CM | POA: Diagnosis not present

## 2016-03-23 DIAGNOSIS — Z947 Corneal transplant status: Secondary | ICD-10-CM | POA: Diagnosis not present

## 2016-03-23 DIAGNOSIS — Z471 Aftercare following joint replacement surgery: Secondary | ICD-10-CM | POA: Diagnosis not present

## 2016-03-23 DIAGNOSIS — Z970 Presence of artificial eye: Secondary | ICD-10-CM | POA: Diagnosis not present

## 2016-03-23 DIAGNOSIS — Z96643 Presence of artificial hip joint, bilateral: Secondary | ICD-10-CM | POA: Diagnosis not present

## 2016-03-24 ENCOUNTER — Encounter: Payer: Self-pay | Admitting: *Deleted

## 2016-03-24 ENCOUNTER — Other Ambulatory Visit: Payer: Self-pay | Admitting: *Deleted

## 2016-03-24 NOTE — Patient Outreach (Addendum)
[facility]) Care Management  03/24/2016  Alexander Carney 22-Sep-1960 [id number]   Subjective:  Received voicemail message from patient, states he is returning call, and requested call back. Telephone call to patient's home /mobile number, spoke with patient, and HIPAA verified.   Discussed [facility] Care Management UMR Transition of care follow up and Link to Wellness program.  Patient voices understanding and is in agreement to transition of care follow up. Patient states he is doing well,  had other hip surgery several months ago, and knew what to expect.   States he is receiving home health physical therapy and it is going well.  Has a hospital follow up appointment with surgeon on 03/28/16.   RNCM educated patient on importance of hospital follow up with primary MD.   Patient voices understanding of education and states he will call primary MD's office to schedule follow up appointment.   RNCM educated patient on supplemental insurance (hospital indemnity), voices understanding, states he will ask to wife if they purchased benefit, and file a claim if appropriate.   Patient voiced appreciation of education and will follow up.   Patient states his wife works for [employer], he works for another employer, and will be paid while he is out on leave.   States he utilizes [facility] for medications.   Patient states he does not have any transition of care, care coordination, disease management, disease monitoring, transportation, community resource, or pharmacy needs at this time.   States he is very Patent attorney of the follow up call, still has access to Link to [employer], and is in agreement to receive successful outreach letter.  Objective: Per chart review: Patient hospitalized  03/15/16  - 03/17/16 for Osteoarthritis of the Righthip.    Status post Righttotal hip arthroplasty, anterior approach on 03/15/16.   Patient also has a history of hyperlipidemia.     Assessment: Received UMR Transition of care follow up referral on 03/16/16.   Transition of care follow up completed, no care management needs, and will proceed with case closure.    Plan: RNCM will send patient successful outreach letter. RNCM will send case closure due to follow up completed / no care management needs request to [facility].     Alexander Carney H. Annia Friendly, BSN, [place] [facility] Telephonic CM Phone: [phone] Fax: [phone]

## 2016-03-27 DIAGNOSIS — Z7982 Long term (current) use of aspirin: Secondary | ICD-10-CM | POA: Diagnosis not present

## 2016-03-27 DIAGNOSIS — Z970 Presence of artificial eye: Secondary | ICD-10-CM | POA: Diagnosis not present

## 2016-03-27 DIAGNOSIS — Z96643 Presence of artificial hip joint, bilateral: Secondary | ICD-10-CM | POA: Diagnosis not present

## 2016-03-27 DIAGNOSIS — Z471 Aftercare following joint replacement surgery: Secondary | ICD-10-CM | POA: Diagnosis not present

## 2016-03-27 DIAGNOSIS — G40909 Epilepsy, unspecified, not intractable, without status epilepticus: Secondary | ICD-10-CM | POA: Diagnosis not present

## 2016-03-27 DIAGNOSIS — I10 Essential (primary) hypertension: Secondary | ICD-10-CM | POA: Diagnosis not present

## 2016-03-27 DIAGNOSIS — Z947 Corneal transplant status: Secondary | ICD-10-CM | POA: Diagnosis not present

## 2016-03-27 DIAGNOSIS — Z79891 Long term (current) use of opiate analgesic: Secondary | ICD-10-CM | POA: Diagnosis not present

## 2016-03-29 DIAGNOSIS — Z7982 Long term (current) use of aspirin: Secondary | ICD-10-CM | POA: Diagnosis not present

## 2016-03-29 DIAGNOSIS — Z471 Aftercare following joint replacement surgery: Secondary | ICD-10-CM | POA: Diagnosis not present

## 2016-03-29 DIAGNOSIS — I10 Essential (primary) hypertension: Secondary | ICD-10-CM | POA: Diagnosis not present

## 2016-03-29 DIAGNOSIS — Z96643 Presence of artificial hip joint, bilateral: Secondary | ICD-10-CM | POA: Diagnosis not present

## 2016-03-29 DIAGNOSIS — Z947 Corneal transplant status: Secondary | ICD-10-CM | POA: Diagnosis not present

## 2016-03-29 DIAGNOSIS — G40909 Epilepsy, unspecified, not intractable, without status epilepticus: Secondary | ICD-10-CM | POA: Diagnosis not present

## 2016-03-29 DIAGNOSIS — Z79891 Long term (current) use of opiate analgesic: Secondary | ICD-10-CM | POA: Diagnosis not present

## 2016-03-29 DIAGNOSIS — Z970 Presence of artificial eye: Secondary | ICD-10-CM | POA: Diagnosis not present

## 2016-04-13 MED FILL — IRBESARTAN-HCTZ 300-12.5 MG: 300-12.5 | 30 days supply | Qty: 30 | Fill #3

## 2016-04-17 MED FILL — ATORVASTATIN 20 MG TABLET: 20 | 30 days supply | Qty: 30 | Fill #9

## 2016-04-17 MED FILL — carBAMazepine 200 MG TABS: 200 | 30 days supply | Qty: 60 | Fill #4

## 2016-04-25 DIAGNOSIS — Z471 Aftercare following joint replacement surgery: Secondary | ICD-10-CM | POA: Diagnosis not present

## 2016-04-25 DIAGNOSIS — Z96641 Presence of right artificial hip joint: Secondary | ICD-10-CM | POA: Diagnosis not present

## 2016-05-18 MED FILL — ATORVASTATIN 20 MG TABLET: 20 | 60 days supply | Qty: 60 | Fill #10

## 2016-05-18 MED FILL — carBAMazepine 200 MG TABS: 200 | 30 days supply | Qty: 60 | Fill #5

## 2016-05-18 MED FILL — IRBESARTAN-HCTZ 300-12.5 MG: 300-12.5 | 30 days supply | Qty: 30 | Fill #4

## 2016-06-16 DIAGNOSIS — Z96641 Presence of right artificial hip joint: Secondary | ICD-10-CM | POA: Diagnosis not present

## 2016-06-16 DIAGNOSIS — Z471 Aftercare following joint replacement surgery: Secondary | ICD-10-CM | POA: Diagnosis not present

## 2016-06-22 MED FILL — IRBESARTAN-HCTZ 300-12.5 MG: 300-12.5 | 30 days supply | Qty: 30 | Fill #5

## 2016-06-23 MED FILL — carBAMazepine 200 MG TABS: 200 | 30 days supply | Qty: 60 | Fill #0

## 2016-07-12 DIAGNOSIS — Z125 Encounter for screening for malignant neoplasm of prostate: Secondary | ICD-10-CM | POA: Diagnosis not present

## 2016-07-12 DIAGNOSIS — I10 Essential (primary) hypertension: Secondary | ICD-10-CM | POA: Diagnosis not present

## 2016-07-12 DIAGNOSIS — D2270 Melanocytic nevi of unspecified lower limb, including hip: Secondary | ICD-10-CM | POA: Diagnosis not present

## 2016-07-12 DIAGNOSIS — Z0000 Encounter for general adult medical examination without abnormal findings: Secondary | ICD-10-CM | POA: Diagnosis not present

## 2016-07-19 DIAGNOSIS — I10 Essential (primary) hypertension: Secondary | ICD-10-CM | POA: Diagnosis not present

## 2016-07-19 DIAGNOSIS — Z0000 Encounter for general adult medical examination without abnormal findings: Secondary | ICD-10-CM | POA: Diagnosis not present

## 2016-07-19 DIAGNOSIS — Z6831 Body mass index (BMI) 31.0-31.9, adult: Secondary | ICD-10-CM | POA: Diagnosis not present

## 2016-07-19 DIAGNOSIS — H3323 Serous retinal detachment, bilateral: Secondary | ICD-10-CM | POA: Diagnosis not present

## 2016-07-19 DIAGNOSIS — E784 Other hyperlipidemia: Secondary | ICD-10-CM | POA: Diagnosis not present

## 2016-07-19 DIAGNOSIS — D229 Melanocytic nevi, unspecified: Secondary | ICD-10-CM | POA: Diagnosis not present

## 2016-07-19 DIAGNOSIS — L723 Sebaceous cyst: Secondary | ICD-10-CM | POA: Diagnosis not present

## 2016-07-19 DIAGNOSIS — M25551 Pain in right hip: Secondary | ICD-10-CM | POA: Diagnosis not present

## 2016-07-19 DIAGNOSIS — D126 Benign neoplasm of colon, unspecified: Secondary | ICD-10-CM | POA: Diagnosis not present

## 2016-07-20 MED FILL — carBAMazepine 200 MG TABS: 200 | 30 days supply | Qty: 60 | Fill #1

## 2016-07-20 MED FILL — ATORVASTATIN 20 MG TABLET: 20 | 90 days supply | Qty: 90 | Fill #0

## 2016-07-20 MED FILL — IRBESARTAN-HCTZ 300-12.5 MG: 300-12.5 | 30 days supply | Qty: 30 | Fill #6

## 2016-08-17 MED FILL — IRBESARTAN-HCTZ 300-12.5 MG: 300-12.5 | 30 days supply | Qty: 30 | Fill #0

## 2016-08-17 MED FILL — carBAMazepine 200 MG TABS: 200 | 30 days supply | Qty: 60 | Fill #2

## 2016-08-25 DIAGNOSIS — D225 Melanocytic nevi of trunk: Secondary | ICD-10-CM | POA: Diagnosis not present

## 2016-08-25 DIAGNOSIS — L720 Epidermal cyst: Secondary | ICD-10-CM | POA: Diagnosis not present

## 2016-08-25 DIAGNOSIS — L918 Other hypertrophic disorders of the skin: Secondary | ICD-10-CM | POA: Diagnosis not present

## 2016-08-25 DIAGNOSIS — C44612 Basal cell carcinoma of skin of right upper limb, including shoulder: Secondary | ICD-10-CM | POA: Diagnosis not present

## 2016-08-29 ENCOUNTER — Ambulatory Visit (INDEPENDENT_AMBULATORY_CARE_PROVIDER_SITE_OTHER): Payer: 59 | Admitting: Ophthalmology

## 2016-12-26 DIAGNOSIS — Z23 Encounter for immunization: Secondary | ICD-10-CM | POA: Diagnosis not present

## 2016-12-26 DIAGNOSIS — Z6832 Body mass index (BMI) 32.0-32.9, adult: Secondary | ICD-10-CM | POA: Diagnosis not present

## 2016-12-26 DIAGNOSIS — E784 Other hyperlipidemia: Secondary | ICD-10-CM | POA: Diagnosis not present

## 2016-12-26 DIAGNOSIS — R3129 Other microscopic hematuria: Secondary | ICD-10-CM | POA: Diagnosis not present

## 2016-12-26 DIAGNOSIS — R569 Unspecified convulsions: Secondary | ICD-10-CM | POA: Diagnosis not present

## 2016-12-26 DIAGNOSIS — I10 Essential (primary) hypertension: Secondary | ICD-10-CM | POA: Diagnosis not present

## 2016-12-27 ENCOUNTER — Encounter (INDEPENDENT_AMBULATORY_CARE_PROVIDER_SITE_OTHER): Payer: 59 | Admitting: Ophthalmology

## 2017-01-17 ENCOUNTER — Encounter (INDEPENDENT_AMBULATORY_CARE_PROVIDER_SITE_OTHER): Payer: Self-pay | Admitting: Ophthalmology

## 2017-01-17 DIAGNOSIS — H33301 Unspecified retinal break, right eye: Secondary | ICD-10-CM | POA: Diagnosis not present

## 2017-01-17 DIAGNOSIS — H35033 Hypertensive retinopathy, bilateral: Secondary | ICD-10-CM | POA: Diagnosis not present

## 2017-01-17 DIAGNOSIS — H338 Other retinal detachments: Secondary | ICD-10-CM | POA: Diagnosis not present

## 2017-01-17 DIAGNOSIS — H26491 Other secondary cataract, right eye: Secondary | ICD-10-CM | POA: Diagnosis not present

## 2017-01-17 DIAGNOSIS — I10 Essential (primary) hypertension: Secondary | ICD-10-CM

## 2017-01-19 ENCOUNTER — Encounter (INDEPENDENT_AMBULATORY_CARE_PROVIDER_SITE_OTHER): Payer: Self-pay | Admitting: Ophthalmology

## 2017-01-26 ENCOUNTER — Encounter (INDEPENDENT_AMBULATORY_CARE_PROVIDER_SITE_OTHER): Payer: 59 | Admitting: Ophthalmology

## 2017-01-26 DIAGNOSIS — H26491 Other secondary cataract, right eye: Secondary | ICD-10-CM

## 2017-02-07 ENCOUNTER — Encounter (INDEPENDENT_AMBULATORY_CARE_PROVIDER_SITE_OTHER): Payer: 59 | Admitting: Ophthalmology

## 2017-03-21 DIAGNOSIS — I10 Essential (primary) hypertension: Secondary | ICD-10-CM | POA: Diagnosis not present

## 2017-03-21 DIAGNOSIS — R569 Unspecified convulsions: Secondary | ICD-10-CM | POA: Diagnosis not present

## 2017-03-21 DIAGNOSIS — Z6833 Body mass index (BMI) 33.0-33.9, adult: Secondary | ICD-10-CM | POA: Diagnosis not present

## 2017-03-21 DIAGNOSIS — Z1389 Encounter for screening for other disorder: Secondary | ICD-10-CM | POA: Diagnosis not present

## 2017-03-21 DIAGNOSIS — H00013 Hordeolum externum right eye, unspecified eyelid: Secondary | ICD-10-CM | POA: Diagnosis not present

## 2017-07-17 DIAGNOSIS — R82998 Other abnormal findings in urine: Secondary | ICD-10-CM | POA: Diagnosis not present

## 2017-08-16 ENCOUNTER — Encounter (INDEPENDENT_AMBULATORY_CARE_PROVIDER_SITE_OTHER): Payer: Self-pay | Admitting: Ophthalmology

## 2017-08-16 DIAGNOSIS — H33301 Unspecified retinal break, right eye: Secondary | ICD-10-CM | POA: Diagnosis not present

## 2017-08-16 DIAGNOSIS — H35033 Hypertensive retinopathy, bilateral: Secondary | ICD-10-CM

## 2017-08-16 DIAGNOSIS — H35031 Hypertensive retinopathy, right eye: Secondary | ICD-10-CM | POA: Diagnosis not present

## 2017-08-16 DIAGNOSIS — I10 Essential (primary) hypertension: Secondary | ICD-10-CM

## 2017-09-24 ENCOUNTER — Encounter (INDEPENDENT_AMBULATORY_CARE_PROVIDER_SITE_OTHER): Payer: PRIVATE HEALTH INSURANCE | Admitting: Ophthalmology

## 2017-12-27 ENCOUNTER — Encounter (INDEPENDENT_AMBULATORY_CARE_PROVIDER_SITE_OTHER): Payer: PRIVATE HEALTH INSURANCE | Admitting: Ophthalmology

## 2017-12-27 DIAGNOSIS — H33301 Unspecified retinal break, right eye: Secondary | ICD-10-CM | POA: Diagnosis not present

## 2017-12-27 DIAGNOSIS — H35033 Hypertensive retinopathy, bilateral: Secondary | ICD-10-CM | POA: Diagnosis not present

## 2017-12-27 DIAGNOSIS — I10 Essential (primary) hypertension: Secondary | ICD-10-CM | POA: Diagnosis not present

## 2018-02-18 ENCOUNTER — Encounter (INDEPENDENT_AMBULATORY_CARE_PROVIDER_SITE_OTHER): Payer: Self-pay | Admitting: Ophthalmology

## 2018-06-05 IMAGING — DX DG PORTABLE PELVIS
1 series · 2 of 2 positions shown · non-contrast
Comparison: Pelvis film of 03/22/2012

CLINICAL DATA: Post right hip replacement

EXAM:
PORTABLE PELVIS 1-2 VIEWS

[Series 1: pelvis ap · 0.14mm/px · 2 of 2 slices shown]
[im 1/2]
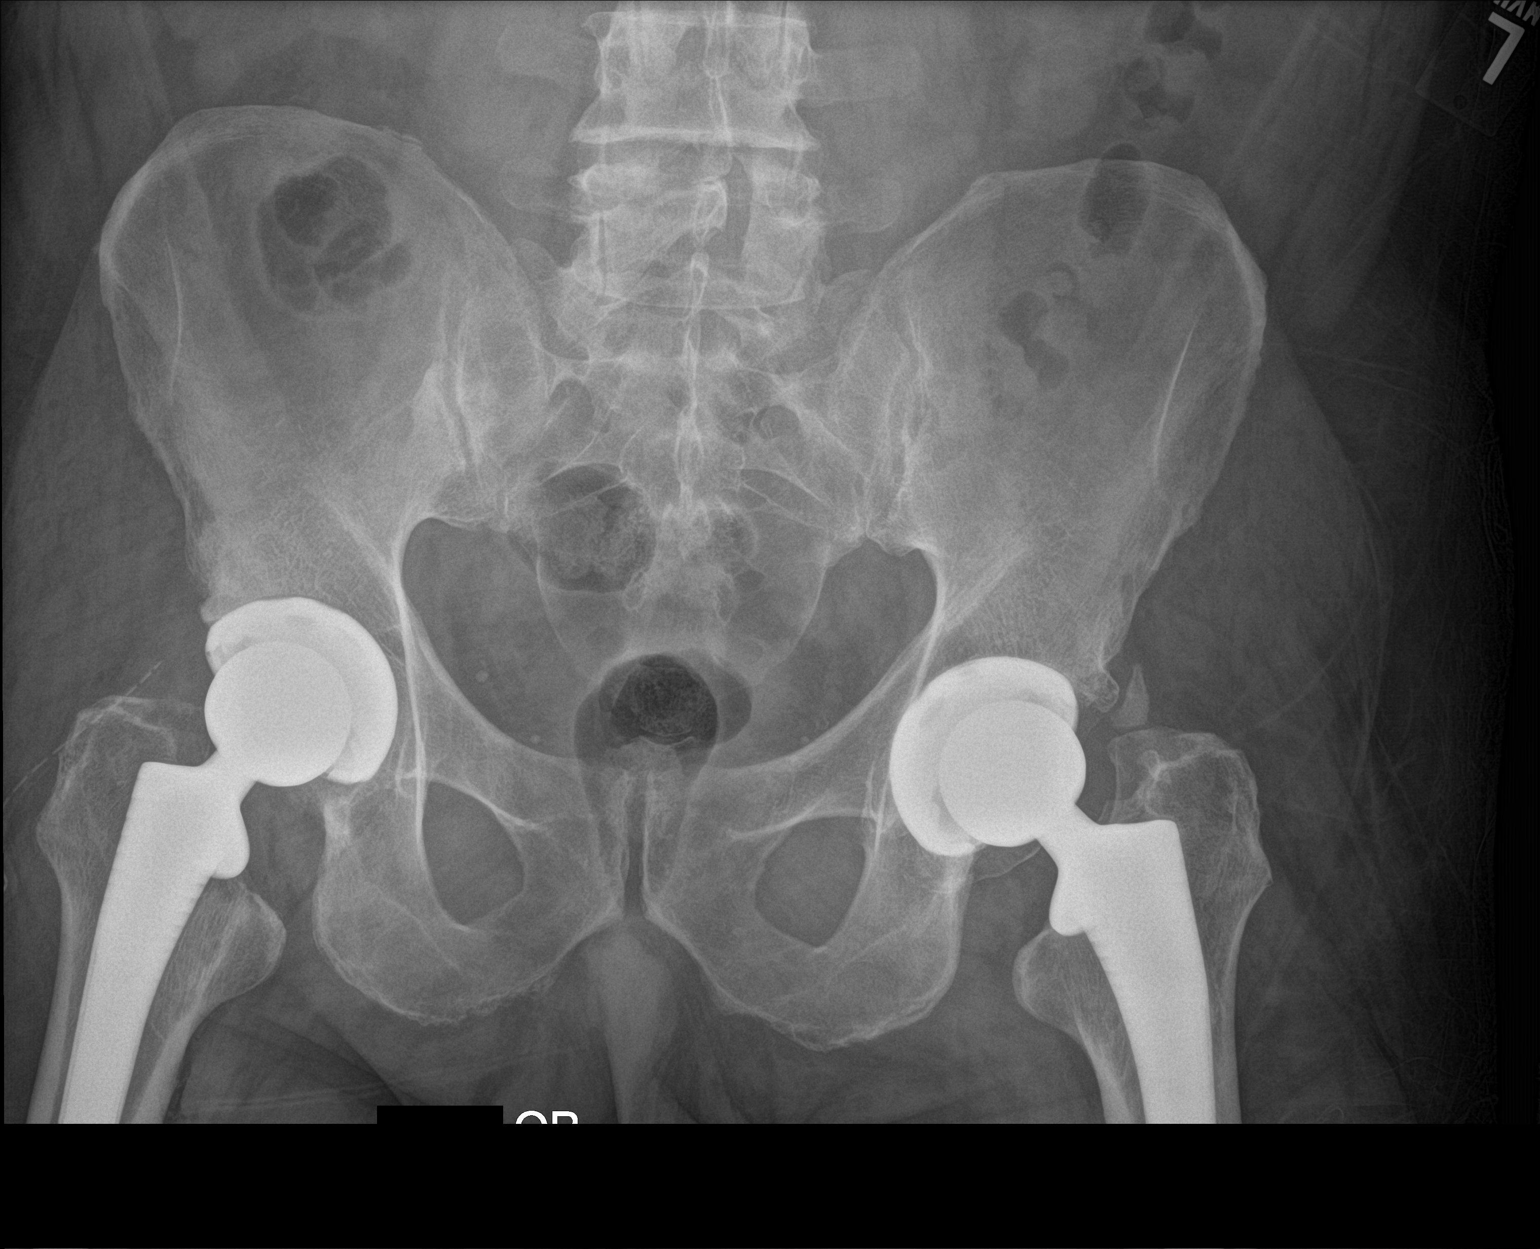
[im 2/2]
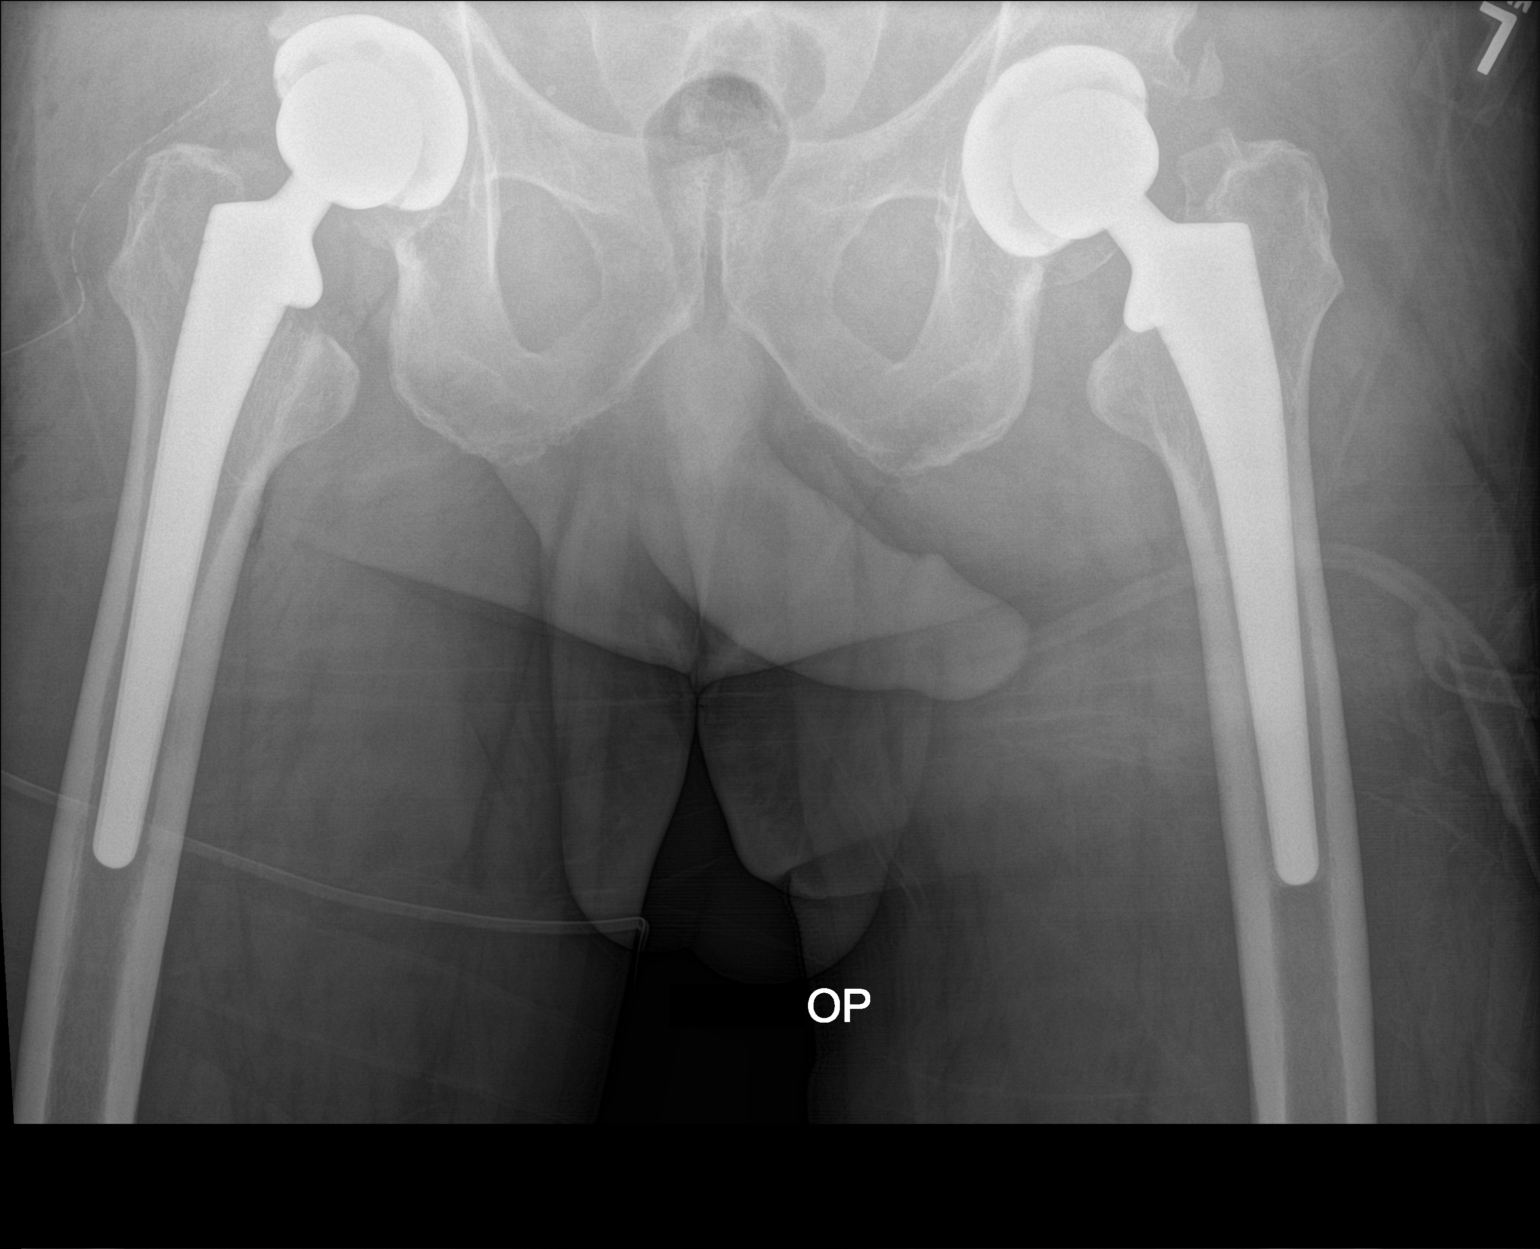

[2 of 2 positions shown; findings below may reference images not displayed]

FINDINGS: A right total hip replacement has been performed. The femoral and
acetabular components are in good position. No complicating features
is seen. A previous left total hip replacement is unchanged in
position. The pelvic rami are intact. The SI joints are corticated.
IMPRESSION: New right total hip replacement components in good position. No
complicating features.

## 2018-07-01 ENCOUNTER — Other Ambulatory Visit: Payer: Self-pay

## 2018-07-01 ENCOUNTER — Encounter (INDEPENDENT_AMBULATORY_CARE_PROVIDER_SITE_OTHER): Payer: Managed Care, Other (non HMO) | Admitting: Ophthalmology

## 2018-07-01 DIAGNOSIS — I10 Essential (primary) hypertension: Secondary | ICD-10-CM | POA: Diagnosis not present

## 2018-07-01 DIAGNOSIS — H33301 Unspecified retinal break, right eye: Secondary | ICD-10-CM | POA: Diagnosis not present

## 2018-07-01 DIAGNOSIS — H35033 Hypertensive retinopathy, bilateral: Secondary | ICD-10-CM | POA: Diagnosis not present

## 2019-01-01 ENCOUNTER — Other Ambulatory Visit: Payer: Self-pay

## 2019-01-01 ENCOUNTER — Encounter (INDEPENDENT_AMBULATORY_CARE_PROVIDER_SITE_OTHER): Payer: Managed Care, Other (non HMO) | Admitting: Ophthalmology

## 2019-01-01 DIAGNOSIS — I10 Essential (primary) hypertension: Secondary | ICD-10-CM

## 2019-01-01 DIAGNOSIS — H33301 Unspecified retinal break, right eye: Secondary | ICD-10-CM | POA: Diagnosis not present

## 2019-01-01 DIAGNOSIS — H35033 Hypertensive retinopathy, bilateral: Secondary | ICD-10-CM | POA: Diagnosis not present

## 2019-05-01 ENCOUNTER — Inpatient Hospital Stay (HOSPITAL_COMMUNITY): Admission: RE | Admit: 2019-05-01 | Payer: 59 | Source: Ambulatory Visit

## 2019-05-01 ENCOUNTER — Other Ambulatory Visit (HOSPITAL_COMMUNITY): Payer: Self-pay | Admitting: Internal Medicine

## 2019-05-01 DIAGNOSIS — M7989 Other specified soft tissue disorders: Secondary | ICD-10-CM

## 2019-05-01 DIAGNOSIS — M79669 Pain in unspecified lower leg: Secondary | ICD-10-CM

## 2019-07-01 ENCOUNTER — Encounter (INDEPENDENT_AMBULATORY_CARE_PROVIDER_SITE_OTHER): Payer: BC Managed Care – PPO | Admitting: Ophthalmology

## 2019-07-01 DIAGNOSIS — H35033 Hypertensive retinopathy, bilateral: Secondary | ICD-10-CM | POA: Diagnosis not present

## 2019-07-01 DIAGNOSIS — H43813 Vitreous degeneration, bilateral: Secondary | ICD-10-CM | POA: Diagnosis not present

## 2019-07-01 DIAGNOSIS — H33301 Unspecified retinal break, right eye: Secondary | ICD-10-CM | POA: Diagnosis not present

## 2019-07-01 DIAGNOSIS — I10 Essential (primary) hypertension: Secondary | ICD-10-CM

## 2019-07-31 DIAGNOSIS — M25561 Pain in right knee: Secondary | ICD-10-CM | POA: Diagnosis not present

## 2019-07-31 DIAGNOSIS — Z471 Aftercare following joint replacement surgery: Secondary | ICD-10-CM | POA: Diagnosis not present

## 2019-07-31 DIAGNOSIS — Z96642 Presence of left artificial hip joint: Secondary | ICD-10-CM | POA: Diagnosis not present

## 2019-08-12 DIAGNOSIS — M25561 Pain in right knee: Secondary | ICD-10-CM | POA: Diagnosis not present

## 2019-08-29 DIAGNOSIS — S83241A Other tear of medial meniscus, current injury, right knee, initial encounter: Secondary | ICD-10-CM | POA: Diagnosis not present

## 2019-08-29 DIAGNOSIS — M25561 Pain in right knee: Secondary | ICD-10-CM | POA: Diagnosis not present

## 2019-10-08 DIAGNOSIS — M25561 Pain in right knee: Secondary | ICD-10-CM | POA: Diagnosis not present

## 2019-10-08 DIAGNOSIS — S83241D Other tear of medial meniscus, current injury, right knee, subsequent encounter: Secondary | ICD-10-CM | POA: Diagnosis not present

## 2019-11-06 DIAGNOSIS — Z0000 Encounter for general adult medical examination without abnormal findings: Secondary | ICD-10-CM | POA: Diagnosis not present

## 2019-11-06 DIAGNOSIS — I10 Essential (primary) hypertension: Secondary | ICD-10-CM | POA: Diagnosis not present

## 2019-11-06 DIAGNOSIS — Z125 Encounter for screening for malignant neoplasm of prostate: Secondary | ICD-10-CM | POA: Diagnosis not present

## 2019-11-06 DIAGNOSIS — E7849 Other hyperlipidemia: Secondary | ICD-10-CM | POA: Diagnosis not present

## 2019-11-06 DIAGNOSIS — R569 Unspecified convulsions: Secondary | ICD-10-CM | POA: Diagnosis not present

## 2019-11-13 DIAGNOSIS — Z23 Encounter for immunization: Secondary | ICD-10-CM | POA: Diagnosis not present

## 2019-11-13 DIAGNOSIS — Z1212 Encounter for screening for malignant neoplasm of rectum: Secondary | ICD-10-CM | POA: Diagnosis not present

## 2019-11-13 DIAGNOSIS — I10 Essential (primary) hypertension: Secondary | ICD-10-CM | POA: Diagnosis not present

## 2019-11-13 DIAGNOSIS — R82998 Other abnormal findings in urine: Secondary | ICD-10-CM | POA: Diagnosis not present

## 2019-11-13 DIAGNOSIS — Z0000 Encounter for general adult medical examination without abnormal findings: Secondary | ICD-10-CM | POA: Diagnosis not present

## 2019-11-17 DIAGNOSIS — Z85828 Personal history of other malignant neoplasm of skin: Secondary | ICD-10-CM | POA: Diagnosis not present

## 2019-11-17 DIAGNOSIS — L814 Other melanin hyperpigmentation: Secondary | ICD-10-CM | POA: Diagnosis not present

## 2019-11-17 DIAGNOSIS — C44319 Basal cell carcinoma of skin of other parts of face: Secondary | ICD-10-CM | POA: Diagnosis not present

## 2019-11-17 DIAGNOSIS — D225 Melanocytic nevi of trunk: Secondary | ICD-10-CM | POA: Diagnosis not present

## 2019-11-17 DIAGNOSIS — L918 Other hypertrophic disorders of the skin: Secondary | ICD-10-CM | POA: Diagnosis not present

## 2019-12-31 ENCOUNTER — Other Ambulatory Visit: Payer: Self-pay

## 2019-12-31 ENCOUNTER — Encounter (INDEPENDENT_AMBULATORY_CARE_PROVIDER_SITE_OTHER): Payer: BC Managed Care – PPO | Admitting: Ophthalmology

## 2019-12-31 DIAGNOSIS — H33301 Unspecified retinal break, right eye: Secondary | ICD-10-CM

## 2019-12-31 DIAGNOSIS — H43811 Vitreous degeneration, right eye: Secondary | ICD-10-CM

## 2019-12-31 DIAGNOSIS — H35031 Hypertensive retinopathy, right eye: Secondary | ICD-10-CM | POA: Diagnosis not present

## 2019-12-31 DIAGNOSIS — I10 Essential (primary) hypertension: Secondary | ICD-10-CM | POA: Diagnosis not present

## 2020-01-01 ENCOUNTER — Encounter (INDEPENDENT_AMBULATORY_CARE_PROVIDER_SITE_OTHER): Payer: BC Managed Care – PPO | Admitting: Ophthalmology

## 2020-02-09 DIAGNOSIS — C44311 Basal cell carcinoma of skin of nose: Secondary | ICD-10-CM | POA: Diagnosis not present

## 2020-02-16 DIAGNOSIS — Z4802 Encounter for removal of sutures: Secondary | ICD-10-CM | POA: Diagnosis not present

## 2020-03-10 DIAGNOSIS — Z20822 Contact with and (suspected) exposure to covid-19: Secondary | ICD-10-CM | POA: Diagnosis not present

## 2020-04-01 DIAGNOSIS — Z23 Encounter for immunization: Secondary | ICD-10-CM | POA: Diagnosis not present

## 2020-04-07 ENCOUNTER — Encounter: Payer: 59 | Admitting: Internal Medicine

## 2020-05-05 ENCOUNTER — Encounter: Payer: Self-pay | Admitting: *Deleted

## 2020-05-05 ENCOUNTER — Ambulatory Visit (AMBULATORY_SURGERY_CENTER): Payer: Self-pay | Admitting: *Deleted

## 2020-05-05 ENCOUNTER — Other Ambulatory Visit: Payer: Self-pay

## 2020-05-05 VITALS — Ht 71.0 in | Wt 248.0 lb

## 2020-05-05 DIAGNOSIS — Z86010 Personal history of colonic polyps: Secondary | ICD-10-CM

## 2020-05-05 MED ORDER — SUTAB 1479-225-188 MG PO TABS: 24.0000 | ORAL_TABLET | ORAL | 0 refills | Status: DC

## 2020-05-05 NOTE — Progress Notes (Signed)
No egg or soy allergy known to patient  No issues with past sedation with any surgeries or procedures No intubation problems in the past  No FH of Malignant Hyperthermia No diet pills per patient No home 02 use per patient  No blood thinners per patient  Pt denies issues with constipation  No A fib or A flutter  EMMI video to pt or via [place] 19 guidelines implemented in [place] today with Pt and RN  Pt is fully vaccinated  for Covid   Due to the COVID-19 pandemic we are asking patients to follow certain guidelines.  Pt aware of COVID protocols and [facility] guidelines   S[employer] coupon to pt- Explained no PA's for prep

## 2020-05-05 NOTE — Telephone Encounter (Signed)
Error encounter. 

## 2020-05-17 ENCOUNTER — Encounter: Payer: Self-pay | Admitting: Internal Medicine

## 2020-05-19 ENCOUNTER — Other Ambulatory Visit: Payer: Self-pay

## 2020-05-19 ENCOUNTER — Ambulatory Visit (AMBULATORY_SURGERY_CENTER): Payer: BC Managed Care – PPO | Admitting: Internal Medicine

## 2020-05-19 ENCOUNTER — Encounter: Payer: Self-pay | Admitting: Internal Medicine

## 2020-05-19 VITALS — BP 115/79 | HR 70 | Temp 98.2°F | Resp 11 | Ht 70.0 in | Wt 248.0 lb

## 2020-05-19 DIAGNOSIS — K635 Polyp of colon: Secondary | ICD-10-CM

## 2020-05-19 DIAGNOSIS — Z860100 Personal history of colon polyps, unspecified: Secondary | ICD-10-CM

## 2020-05-19 DIAGNOSIS — Z1211 Encounter for screening for malignant neoplasm of colon: Secondary | ICD-10-CM | POA: Diagnosis not present

## 2020-05-19 DIAGNOSIS — D123 Benign neoplasm of transverse colon: Secondary | ICD-10-CM

## 2020-05-19 MED ORDER — SODIUM CHLORIDE 0.9 % IV SOLN: 500.0000 mL | Freq: Once | INTRAVENOUS | Status: DC

## 2020-05-19 NOTE — Progress Notes (Signed)
Called to room to assist during endoscopic procedure.  Patient ID and intended procedure confirmed with present staff. Received instructions for my participation in the procedure from the performing physician.  

## 2020-05-19 NOTE — Progress Notes (Signed)
Pt's states no medical or surgical changes since previsit or office visit. 

## 2020-05-19 NOTE — Op Note (Signed)
[facility] Patient Name: Alexander Carney Procedure Date: 05/19/2020 1:40 PM MRN: [id number] Endoscopist: Jerene Bears , MD Age: 60 Referring MD:  Date of Birth: [birth year] Gender: Male Account #: [id number] Procedure:                Colonoscopy Indications:              High risk colon cancer surveillance: Personal                            history of non-advanced adenomas, Last colonoscopy:                            October 2016 Medicines:                Monitored Anesthesia Care Procedure:                Pre-Anesthesia Assessment:                           - Prior to the procedure, a History and Physical                            was performed, and patient medications and                            allergies were reviewed. The patient's tolerance of                            previous anesthesia was also reviewed. The risks                            and benefits of the procedure and the sedation                            options and risks were discussed with the patient.                            All questions were answered, and informed consent                            was obtained. Prior Anticoagulants: The patient has                            taken no previous anticoagulant or antiplatelet                            agents. ASA Grade Assessment: II - A patient with                            mild systemic disease. After reviewing the risks                            and benefits, the patient was deemed in  satisfactory condition to undergo the procedure.                           After obtaining informed consent, the colonoscope                            was passed under direct vision. Throughout the                            procedure, the patient's blood pressure, pulse, and                            oxygen saturations were monitored continuously. The                            Colonoscope was introduced through the anus and                             advanced to the cecum, identified by appendiceal                            orifice and ileocecal valve. The colonoscopy was                            performed without difficulty. The patient tolerated                            the procedure well. The quality of the bowel                            preparation was good. The ileocecal valve,                            appendiceal orifice, and rectum were photographed. Scope In: 1:44:13 PM Scope Out: 1:58:53 PM Scope Withdrawal Time: 0 hours 12 minutes 21 seconds  Total Procedure Duration: 0 hours 14 minutes 40 seconds  Findings:                 The digital rectal exam was normal.                           A 8 mm polyp was found in the transverse colon. The                            polyp was flat. The polyp was removed with a cold                            snare. Resection and retrieval were complete.                           A few small-mouthed diverticula were found in the                            sigmoid colon.  The retroflexed view of the distal rectum and anal                            verge was normal and showed no anal or rectal                            abnormalities. Complications:            No immediate complications. Estimated Blood Loss:     Estimated blood loss was minimal. Impression:               - One 8 mm polyp in the transverse colon, removed                            with a cold snare. Resected and retrieved.                           - Diverticulosis in the sigmoid colon.                           - The distal rectum and anal verge are normal on                            retroflexion view. Recommendation:           - Patient has a contact number available for                            emergencies. The signs and symptoms of potential                            delayed complications were discussed with the                            patient. Return to normal  activities tomorrow.                            Written discharge instructions were provided to the                            patient.                           - Resume previous diet.                           - Continue present medications.                           - Await pathology results.                           - Repeat colonoscopy is recommended for                            surveillance. The colonoscopy date will be  determined after pathology results from today's                            exam become available for review. Jerene Bears, MD 05/19/2020 2:01:25 PM This report has been signed electronically.

## 2020-05-19 NOTE — Progress Notes (Signed)
Pt Drowsy. VSS. To PACU, report to RN. No anesthetic complications noted.  

## 2020-05-19 NOTE — Patient Instructions (Signed)
Discharge instructions given. Handouts on polyps and diverticulosis. Resume previous medications. YOU HAD AN ENDOSCOPIC PROCEDURE TODAY AT THE [facility]:   Refer to the procedure report that was given to you for any specific questions about what was found during the examination.  If the procedure report does not answer your questions, please call your gastroenterologist to clarify.  If you requested that your care partner not be given the details of your procedure findings, then the procedure report has been included in a sealed envelope for you to review at your convenience later.  YOU SHOULD EXPECT: Some feelings of bloating in the abdomen. Passage of more gas than usual.  Walking can help get rid of the air that was put into your GI tract during the procedure and reduce the bloating. If you had a lower endoscopy (such as a colonoscopy or flexible sigmoidoscopy) you may notice spotting of blood in your stool or on the toilet paper. If you underwent a bowel prep for your procedure, you may not have a normal bowel movement for a few days.  Please Note:  You might notice some irritation and congestion in your nose or some drainage.  This is from the oxygen used during your procedure.  There is no need for concern and it should clear up in a day or so.  SYMPTOMS TO REPORT IMMEDIATELY:   Following lower endoscopy (colonoscopy or flexible sigmoidoscopy):  Excessive amounts of blood in the stool  Significant tenderness or worsening of abdominal pains  Swelling of the abdomen that is new, acute  Fever of 100F or higher   For urgent or emergent issues, a gastroenterologist can be reached at any hour by calling [phone]. Do not use MyChart messaging for urgent concerns.    DIET:  We do recommend a small meal at first, but then you may proceed to your regular diet.  Drink plenty of fluids but you should avoid alcoholic beverages for 24 hours.  ACTIVITY:  You should plan to take  it easy for the rest of today and you should NOT DRIVE or use heavy machinery until tomorrow (because of the sedation medicines used during the test).    FOLLOW UP: Our staff will call the number listed on your records 48-72 hours following your procedure to check on you and address any questions or concerns that you may have regarding the information given to you following your procedure. If we do not reach you, we will leave a message.  We will attempt to reach you two times.  During this call, we will ask if you have developed any symptoms of COVID 19. If you develop any symptoms (ie: fever, flu-like symptoms, shortness of breath, cough etc.) before then, please call [phone].  If you test positive for Covid 19 in the 2 weeks post procedure, please call and report this information to us.    If any biopsies were taken you will be contacted by phone or by letter within the next 1-3 weeks.  Please call us at [phone] if you have not heard about the biopsies in 3 weeks.    SIGNATURES/CONFIDENTIALITY: You and/or your care partner have signed paperwork which will be entered into your electronic medical record.  These signatures attest to the fact that that the information above on your After Visit Summary has been reviewed and is understood.  Full responsibility of the confidentiality of this discharge information lies with you and/or your care-partner. 

## 2020-05-19 NOTE — Progress Notes (Signed)
VS taken by C.W. 

## 2020-05-21 ENCOUNTER — Telehealth: Payer: Self-pay | Admitting: *Deleted

## 2020-05-21 NOTE — Telephone Encounter (Signed)
  Follow up Call-  Call back number 05/19/2020  Post procedure Call Back phone  # [phone]  Permission to leave phone message Yes  Some recent data might be hidden     Patient questions:  Do you have a fever, pain , or abdominal swelling? No. Pain Score  0 *  Have you tolerated food without any problems? Yes.    Have you been able to return to your normal activities? Yes.    Do you have any questions about your discharge instructions: Diet   No. Medications  No. Follow up visit  No.  Do you have questions or concerns about your Care? No.  Actions: * If pain score is 4 or above: No action needed, pain <4.  1. Have you developed a fever since your procedure? no  2.   Have you had an respiratory symptoms (SOB or cough) since your procedure? no  3.   Have you tested positive for COVID 19 since your procedure no  4.   Have you had any family members/close contacts diagnosed with the COVID 19 since your procedure?  no   If yes to any of these questions please route to Joylene John, RN and Joella Prince, RN

## 2020-05-27 ENCOUNTER — Encounter: Payer: Self-pay | Admitting: Internal Medicine

## 2020-06-02 ENCOUNTER — Telehealth: Payer: Self-pay | Admitting: Internal Medicine

## 2020-06-02 NOTE — Telephone Encounter (Signed)
Patient called requesting path results 

## 2020-06-02 NOTE — Telephone Encounter (Signed)
Path letter reviewed with pt over the phone. °

## 2020-06-29 ENCOUNTER — Encounter (INDEPENDENT_AMBULATORY_CARE_PROVIDER_SITE_OTHER): Payer: BC Managed Care – PPO | Admitting: Ophthalmology

## 2020-06-29 ENCOUNTER — Other Ambulatory Visit: Payer: Self-pay

## 2020-06-29 DIAGNOSIS — I10 Essential (primary) hypertension: Secondary | ICD-10-CM

## 2020-06-29 DIAGNOSIS — H33301 Unspecified retinal break, right eye: Secondary | ICD-10-CM | POA: Diagnosis not present

## 2020-06-29 DIAGNOSIS — H35031 Hypertensive retinopathy, right eye: Secondary | ICD-10-CM

## 2020-08-30 DIAGNOSIS — L570 Actinic keratosis: Secondary | ICD-10-CM | POA: Diagnosis not present

## 2020-08-30 DIAGNOSIS — D2261 Melanocytic nevi of right upper limb, including shoulder: Secondary | ICD-10-CM | POA: Diagnosis not present

## 2020-08-30 DIAGNOSIS — D225 Melanocytic nevi of trunk: Secondary | ICD-10-CM | POA: Diagnosis not present

## 2020-08-30 DIAGNOSIS — L820 Inflamed seborrheic keratosis: Secondary | ICD-10-CM | POA: Diagnosis not present

## 2020-08-30 DIAGNOSIS — Z85828 Personal history of other malignant neoplasm of skin: Secondary | ICD-10-CM | POA: Diagnosis not present

## 2020-11-02 DIAGNOSIS — J3501 Chronic tonsillitis: Secondary | ICD-10-CM | POA: Diagnosis not present

## 2020-11-02 DIAGNOSIS — J342 Deviated nasal septum: Secondary | ICD-10-CM | POA: Diagnosis not present

## 2020-11-02 DIAGNOSIS — R0683 Snoring: Secondary | ICD-10-CM | POA: Diagnosis not present

## 2020-11-02 DIAGNOSIS — J343 Hypertrophy of nasal turbinates: Secondary | ICD-10-CM | POA: Diagnosis not present

## 2020-11-18 DIAGNOSIS — M25552 Pain in left hip: Secondary | ICD-10-CM | POA: Diagnosis not present

## 2020-12-10 DIAGNOSIS — M25552 Pain in left hip: Secondary | ICD-10-CM | POA: Diagnosis not present

## 2020-12-10 DIAGNOSIS — M25561 Pain in right knee: Secondary | ICD-10-CM | POA: Diagnosis not present

## 2020-12-21 DIAGNOSIS — Z125 Encounter for screening for malignant neoplasm of prostate: Secondary | ICD-10-CM | POA: Diagnosis not present

## 2020-12-21 DIAGNOSIS — E785 Hyperlipidemia, unspecified: Secondary | ICD-10-CM | POA: Diagnosis not present

## 2021-01-04 DIAGNOSIS — S76111A Strain of right quadriceps muscle, fascia and tendon, initial encounter: Secondary | ICD-10-CM | POA: Diagnosis not present

## 2021-01-12 DIAGNOSIS — R82998 Other abnormal findings in urine: Secondary | ICD-10-CM | POA: Diagnosis not present

## 2021-01-12 DIAGNOSIS — I10 Essential (primary) hypertension: Secondary | ICD-10-CM | POA: Diagnosis not present

## 2021-01-12 DIAGNOSIS — Z0000 Encounter for general adult medical examination without abnormal findings: Secondary | ICD-10-CM | POA: Diagnosis not present

## 2021-01-12 DIAGNOSIS — R3129 Other microscopic hematuria: Secondary | ICD-10-CM | POA: Diagnosis not present

## 2021-03-31 DIAGNOSIS — M25561 Pain in right knee: Secondary | ICD-10-CM | POA: Diagnosis not present

## 2021-05-13 DIAGNOSIS — R569 Unspecified convulsions: Secondary | ICD-10-CM | POA: Diagnosis not present

## 2021-05-13 DIAGNOSIS — I10 Essential (primary) hypertension: Secondary | ICD-10-CM | POA: Diagnosis not present

## 2021-05-23 DIAGNOSIS — Z85828 Personal history of other malignant neoplasm of skin: Secondary | ICD-10-CM | POA: Diagnosis not present

## 2021-05-23 DIAGNOSIS — D225 Melanocytic nevi of trunk: Secondary | ICD-10-CM | POA: Diagnosis not present

## 2021-05-23 DIAGNOSIS — D2261 Melanocytic nevi of right upper limb, including shoulder: Secondary | ICD-10-CM | POA: Diagnosis not present

## 2021-05-23 DIAGNOSIS — L570 Actinic keratosis: Secondary | ICD-10-CM | POA: Diagnosis not present

## 2021-06-29 ENCOUNTER — Other Ambulatory Visit: Payer: Self-pay

## 2021-06-29 ENCOUNTER — Encounter (INDEPENDENT_AMBULATORY_CARE_PROVIDER_SITE_OTHER): Payer: BC Managed Care – PPO | Admitting: Ophthalmology

## 2021-06-29 DIAGNOSIS — I10 Essential (primary) hypertension: Secondary | ICD-10-CM | POA: Diagnosis not present

## 2021-06-29 DIAGNOSIS — H33301 Unspecified retinal break, right eye: Secondary | ICD-10-CM

## 2021-06-29 DIAGNOSIS — H35031 Hypertensive retinopathy, right eye: Secondary | ICD-10-CM

## 2021-07-26 ENCOUNTER — Emergency Department (HOSPITAL_BASED_OUTPATIENT_CLINIC_OR_DEPARTMENT_OTHER)
Admission: EM | Admit: 2021-07-26 | Discharge: 2021-07-26 | Disposition: A | Discharge status: Home / Self Care | Payer: BC Managed Care – PPO | Attending: Emergency Medicine | Admitting: Emergency Medicine

## 2021-07-26 ENCOUNTER — Other Ambulatory Visit: Payer: Self-pay

## 2021-07-26 ENCOUNTER — Encounter (HOSPITAL_BASED_OUTPATIENT_CLINIC_OR_DEPARTMENT_OTHER): Payer: Self-pay | Admitting: Obstetrics and Gynecology

## 2021-07-26 DIAGNOSIS — M79621 Pain in right upper arm: Secondary | ICD-10-CM | POA: Insufficient documentation

## 2021-07-26 DIAGNOSIS — X501XXA Overexertion from prolonged static or awkward postures, initial encounter: Secondary | ICD-10-CM | POA: Insufficient documentation

## 2021-07-26 DIAGNOSIS — M79601 Pain in right arm: Secondary | ICD-10-CM | POA: Diagnosis not present

## 2021-07-26 DIAGNOSIS — Z79899 Other long term (current) drug therapy: Secondary | ICD-10-CM | POA: Diagnosis not present

## 2021-07-26 DIAGNOSIS — Z7982 Long term (current) use of aspirin: Secondary | ICD-10-CM | POA: Insufficient documentation

## 2021-07-26 NOTE — Discharge Instructions (Signed)
Limit activities which worsen pain.  Take ibuprofen as/or Tylenol as needed.  There is a number below to call for orthopedic follow-up if you do experience persistent symptoms. ?

## 2021-07-26 NOTE — ED Triage Notes (Signed)
Patient endorses right arm pain after helping move heavy furniture yesterday. Patient reports he is concerned about a bicep injury. Denies chest pain or ShOB.  ?

## 2021-07-26 NOTE — ED Provider Notes (Signed)
?[facility] ?Provider Note ? ? ?CSN: [id number] ?Arrival date & time: 07/26/21  0750 ? ?  ? ?History ? ?Chief Complaint  ?Patient presents with  ? Arm Pain  ? ? ?Alexander Carney is a 61 y.o. male. ? ? ?Arm Pain ?Patient presents for right arm pain.  Yesterday he was moving a heavy piece of furniture.  While he was doing this, he felt a pop on the lateral aspect of his right AC.  He felt immediate pain.  He did apply ice shortly after the injury.  Since that time, he has had ongoing mild pain.  He denies any swelling.  He has been able to range his elbow without any discomfort.  He experiences mild discomfort with supination/pronation of his forearm.  ? ?  ? ?Home Medications ?Prior to Admission medications   ?Medication Sig Start Date End Date Taking? Authorizing Provider  ?amLODipine (NORVASC) 5 MG tablet Take 5 mg by mouth daily. 03/18/20   [provider]  ?aspirin 81 MG EC tablet Take by mouth.    [provider]  ?atorvastatin (LIPITOR) 20 MG tablet Take 20 mg by mouth daily.    [provider]  ?carbamazepine (TEGRETOL) 200 MG tablet Take 200 mg by mouth 2 (two) times daily. 02/22/16   [provider]  ?EPINEPHrine 0.3 mg/0.3 mL IJ SOAJ injection Inject 0.3 mg into the muscle daily as needed (for allergic reactions). ?Patient not taking: No sig reported    [provider]  ?irbesartan-hydrochlorothiazide (AVALIDE) 300-12.5 MG tablet Take 1 tablet by mouth daily.  02/22/16   [provider]  ?   ? ?Allergies    ?Bee venom   ? ?Review of Systems   ?Review of Systems  ?Musculoskeletal:  Positive for arthralgias.  ?All other systems reviewed and are negative. ? ?Physical Exam ?Updated Vital Signs ?BP (!) 158/97   Pulse 80   Resp 16   SpO2 99%  ?Physical Exam ?Vitals and nursing note reviewed.  ?Constitutional:   ?   General: He is not in acute distress. ?   Appearance: Normal appearance. He is well-developed and normal weight. He is  not ill-appearing, toxic-appearing or diaphoretic.  ?HENT:  ?   Head: Normocephalic and atraumatic.  ?   Right Ear: External ear normal.  ?   Left Ear: External ear normal.  ?   Nose: Nose normal.  ?Eyes:  ?   General: No scleral icterus. ?   Extraocular Movements: Extraocular movements intact.  ?   Conjunctiva/sclera: Conjunctivae normal.  ?Pulmonary:  ?   Effort: Pulmonary effort is normal. No respiratory distress.  ?Abdominal:  ?   General: There is no distension.  ?Musculoskeletal:     ?   General: No swelling, tenderness or deformity. Normal range of motion.  ?   Cervical back: Normal range of motion and neck supple.  ?Skin: ?   General: Skin is warm and dry.  ?   Capillary Refill: Capillary refill takes less than 2 seconds.  ?   Coloration: Skin is not jaundiced or pale.  ?Neurological:  ?   General: No focal deficit present.  ?   Mental Status: He is alert and oriented to person, place, and time.  ?   Cranial Nerves: No cranial nerve deficit.  ?   Sensory: No sensory deficit.  ?   Motor: No weakness.  ?   Coordination: Coordination normal.  ?Psychiatric:     ?   Mood and Affect:  Mood normal.     ?   Behavior: Behavior normal.     ?   Thought Content: Thought content normal.     ?   Judgment: Judgment normal.  ? ? ?ED Results / Procedures / Treatments   ?Labs ?(all labs ordered are listed, but only abnormal results are displayed) ?Labs Reviewed - No data to display ? ?EKG ?None ? ?Radiology ?No results found. ? ?Procedures ?Procedures  ? ? ?Medications Ordered in ED ?Medications - No data to display ? ?ED Course/ Medical Decision Making/ A&P ?  ?                        ?Medical Decision Making ? ?Healthy 61 year old male presenting for injury to his right arm.  This occurred yesterday.  He describes a lifting motion resulting in a pop that was in the area of lateral right AC.  He immediately had pain in this area.  History is consistent with likely partial tear of biceps tendon.  On exam, there is no areas of  swelling, bruising, or erythema.  Range of motion is intact.  He has no discomfort with elbow flexion against resistance.  He has mild discomfort with supination/pronation of his forearm.  I do not suspect bony injury.  I do not feel that any imaging is indicated at this time.  Patient was advised to limit activities that worsen his pain and to take ibuprofen and Tylenol as needed.  He is also advised to follow-up with orthopedic surgery/first medicine for any persistent pain beyond this week.  He was discharged in good condition. ? ? ? ? ? ? ? ?Final Clinical Impression(s) / ED Diagnoses ?Final diagnoses:  ?Right arm pain  ? ? ?Rx / DC Orders ?ED Discharge Orders   ? ? None  ? ?  ? ? ?  ?Godfrey Pick, MD ?07/26/21 [phone] ? ?

## 2021-08-22 DIAGNOSIS — I10 Essential (primary) hypertension: Secondary | ICD-10-CM | POA: Diagnosis not present

## 2021-09-07 DIAGNOSIS — Z85828 Personal history of other malignant neoplasm of skin: Secondary | ICD-10-CM | POA: Diagnosis not present

## 2021-09-07 DIAGNOSIS — L570 Actinic keratosis: Secondary | ICD-10-CM | POA: Diagnosis not present

## 2021-09-07 DIAGNOSIS — D225 Melanocytic nevi of trunk: Secondary | ICD-10-CM | POA: Diagnosis not present

## 2021-09-07 DIAGNOSIS — L821 Other seborrheic keratosis: Secondary | ICD-10-CM | POA: Diagnosis not present

## 2022-03-20 DIAGNOSIS — Z1212 Encounter for screening for malignant neoplasm of rectum: Secondary | ICD-10-CM | POA: Diagnosis not present

## 2022-03-20 DIAGNOSIS — E785 Hyperlipidemia, unspecified: Secondary | ICD-10-CM | POA: Diagnosis not present

## 2022-03-20 DIAGNOSIS — Z125 Encounter for screening for malignant neoplasm of prostate: Secondary | ICD-10-CM | POA: Diagnosis not present

## 2022-03-27 DIAGNOSIS — R82998 Other abnormal findings in urine: Secondary | ICD-10-CM | POA: Diagnosis not present

## 2022-03-27 DIAGNOSIS — Z1331 Encounter for screening for depression: Secondary | ICD-10-CM | POA: Diagnosis not present

## 2022-03-27 DIAGNOSIS — Z0000 Encounter for general adult medical examination without abnormal findings: Secondary | ICD-10-CM | POA: Diagnosis not present

## 2022-03-27 DIAGNOSIS — Z23 Encounter for immunization: Secondary | ICD-10-CM | POA: Diagnosis not present

## 2022-03-27 DIAGNOSIS — I10 Essential (primary) hypertension: Secondary | ICD-10-CM | POA: Diagnosis not present

## 2022-06-30 ENCOUNTER — Encounter (INDEPENDENT_AMBULATORY_CARE_PROVIDER_SITE_OTHER): Payer: BC Managed Care – PPO | Admitting: Ophthalmology

## 2022-06-30 DIAGNOSIS — H35031 Hypertensive retinopathy, right eye: Secondary | ICD-10-CM

## 2022-06-30 DIAGNOSIS — H43811 Vitreous degeneration, right eye: Secondary | ICD-10-CM | POA: Diagnosis not present

## 2022-06-30 DIAGNOSIS — I10 Essential (primary) hypertension: Secondary | ICD-10-CM | POA: Diagnosis not present

## 2022-06-30 DIAGNOSIS — H33301 Unspecified retinal break, right eye: Secondary | ICD-10-CM

## 2022-07-27 DIAGNOSIS — D485 Neoplasm of uncertain behavior of skin: Secondary | ICD-10-CM | POA: Diagnosis not present

## 2022-07-27 DIAGNOSIS — D2261 Melanocytic nevi of right upper limb, including shoulder: Secondary | ICD-10-CM | POA: Diagnosis not present

## 2022-07-27 DIAGNOSIS — Z85828 Personal history of other malignant neoplasm of skin: Secondary | ICD-10-CM | POA: Diagnosis not present

## 2022-07-27 DIAGNOSIS — C44329 Squamous cell carcinoma of skin of other parts of face: Secondary | ICD-10-CM | POA: Diagnosis not present

## 2022-07-27 DIAGNOSIS — L821 Other seborrheic keratosis: Secondary | ICD-10-CM | POA: Diagnosis not present

## 2022-07-27 DIAGNOSIS — D225 Melanocytic nevi of trunk: Secondary | ICD-10-CM | POA: Diagnosis not present

## 2022-08-01 DIAGNOSIS — I10 Essential (primary) hypertension: Secondary | ICD-10-CM | POA: Diagnosis not present

## 2022-08-01 DIAGNOSIS — D229 Melanocytic nevi, unspecified: Secondary | ICD-10-CM | POA: Diagnosis not present

## 2022-08-01 DIAGNOSIS — H3323 Serous retinal detachment, bilateral: Secondary | ICD-10-CM | POA: Diagnosis not present

## 2022-08-01 DIAGNOSIS — E785 Hyperlipidemia, unspecified: Secondary | ICD-10-CM | POA: Diagnosis not present

## 2022-10-20 DIAGNOSIS — I10 Essential (primary) hypertension: Secondary | ICD-10-CM | POA: Diagnosis not present

## 2023-02-27 DIAGNOSIS — I10 Essential (primary) hypertension: Secondary | ICD-10-CM | POA: Diagnosis not present

## 2023-02-27 DIAGNOSIS — Z23 Encounter for immunization: Secondary | ICD-10-CM | POA: Diagnosis not present

## 2023-04-10 DIAGNOSIS — Z125 Encounter for screening for malignant neoplasm of prostate: Secondary | ICD-10-CM | POA: Diagnosis not present

## 2023-04-10 DIAGNOSIS — E785 Hyperlipidemia, unspecified: Secondary | ICD-10-CM | POA: Diagnosis not present

## 2023-04-10 DIAGNOSIS — I10 Essential (primary) hypertension: Secondary | ICD-10-CM | POA: Diagnosis not present

## 2023-05-09 DIAGNOSIS — D2261 Melanocytic nevi of right upper limb, including shoulder: Secondary | ICD-10-CM | POA: Diagnosis not present

## 2023-05-09 DIAGNOSIS — D225 Melanocytic nevi of trunk: Secondary | ICD-10-CM | POA: Diagnosis not present

## 2023-05-09 DIAGNOSIS — Z85828 Personal history of other malignant neoplasm of skin: Secondary | ICD-10-CM | POA: Diagnosis not present

## 2023-05-09 DIAGNOSIS — C4441 Basal cell carcinoma of skin of scalp and neck: Secondary | ICD-10-CM | POA: Diagnosis not present

## 2023-05-09 DIAGNOSIS — L821 Other seborrheic keratosis: Secondary | ICD-10-CM | POA: Diagnosis not present

## 2023-05-22 DIAGNOSIS — Z1339 Encounter for screening examination for other mental health and behavioral disorders: Secondary | ICD-10-CM | POA: Diagnosis not present

## 2023-05-22 DIAGNOSIS — Z1331 Encounter for screening for depression: Secondary | ICD-10-CM | POA: Diagnosis not present

## 2023-05-22 DIAGNOSIS — I10 Essential (primary) hypertension: Secondary | ICD-10-CM | POA: Diagnosis not present

## 2023-05-22 DIAGNOSIS — Z0000 Encounter for general adult medical examination without abnormal findings: Secondary | ICD-10-CM | POA: Diagnosis not present

## 2023-07-06 ENCOUNTER — Encounter (INDEPENDENT_AMBULATORY_CARE_PROVIDER_SITE_OTHER): Payer: BC Managed Care – PPO | Admitting: Ophthalmology

## 2023-07-06 DIAGNOSIS — H35031 Hypertensive retinopathy, right eye: Secondary | ICD-10-CM

## 2023-07-06 DIAGNOSIS — I10 Essential (primary) hypertension: Secondary | ICD-10-CM

## 2023-07-06 DIAGNOSIS — H33301 Unspecified retinal break, right eye: Secondary | ICD-10-CM

## 2023-08-30 DIAGNOSIS — I10 Essential (primary) hypertension: Secondary | ICD-10-CM | POA: Diagnosis not present

## 2023-08-30 DIAGNOSIS — E785 Hyperlipidemia, unspecified: Secondary | ICD-10-CM | POA: Diagnosis not present

## 2023-11-28 DIAGNOSIS — I10 Essential (primary) hypertension: Secondary | ICD-10-CM | POA: Diagnosis not present

## 2024-01-03 ENCOUNTER — Emergency Department (HOSPITAL_BASED_OUTPATIENT_CLINIC_OR_DEPARTMENT_OTHER): Admitting: Radiology

## 2024-01-03 ENCOUNTER — Emergency Department (HOSPITAL_BASED_OUTPATIENT_CLINIC_OR_DEPARTMENT_OTHER)
Admission: EM | Admit: 2024-01-03 | Discharge: 2024-01-03 | Disposition: A | Discharge status: Home / Self Care | Attending: Emergency Medicine | Admitting: Emergency Medicine

## 2024-01-03 ENCOUNTER — Other Ambulatory Visit: Payer: Self-pay

## 2024-01-03 DIAGNOSIS — Z7982 Long term (current) use of aspirin: Secondary | ICD-10-CM | POA: Diagnosis not present

## 2024-01-03 DIAGNOSIS — Y92410 Unspecified street and highway as the place of occurrence of the external cause: Secondary | ICD-10-CM | POA: Diagnosis not present

## 2024-01-03 DIAGNOSIS — S51812A Laceration without foreign body of left forearm, initial encounter: Secondary | ICD-10-CM | POA: Diagnosis not present

## 2024-01-03 DIAGNOSIS — I10 Essential (primary) hypertension: Secondary | ICD-10-CM | POA: Diagnosis not present

## 2024-01-03 DIAGNOSIS — Z79899 Other long term (current) drug therapy: Secondary | ICD-10-CM | POA: Insufficient documentation

## 2024-01-03 DIAGNOSIS — S59912A Unspecified injury of left forearm, initial encounter: Secondary | ICD-10-CM | POA: Diagnosis not present

## 2024-01-03 MED ORDER — CEFADROXIL 500 MG PO CAPS: 500.0000 mg | ORAL_CAPSULE | Freq: Two times a day (BID) | ORAL | 0 refills | Status: AC

## 2024-01-03 MED ORDER — LIDOCAINE-EPINEPHRINE (PF) 2 %-1:200000 IJ SOLN
10.0000 mL | Freq: Once | INTRAMUSCULAR | Status: AC
  Administered 2024-01-03: 10 mL
  Filled 2024-01-03: qty 20
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
## 2024-01-03 NOTE — Discharge Instructions (Signed)
 Your laceration was repaired using an absorbable stitch.  This should fall off in around a week.  Continue to clean the area with warm soapy water .  Change bandages at least daily.  Recommend follow-up department if reassessment.

## 2024-07-09 ENCOUNTER — Encounter (INDEPENDENT_AMBULATORY_CARE_PROVIDER_SITE_OTHER): Admitting: Ophthalmology
